# Patient Record
Sex: Female | Born: 1975 | Race: White | Hispanic: No | Smoking: Current every day smoker
Health system: Southern US, Community
[De-identification: ages and names within clinical notes are randomized; demographics above are authoritative.]

## PROBLEM LIST (undated history)

## (undated) DIAGNOSIS — T8859XA Other complications of anesthesia, initial encounter: Secondary | ICD-10-CM

## (undated) DIAGNOSIS — F32A Depression, unspecified: Secondary | ICD-10-CM

## (undated) DIAGNOSIS — D219 Benign neoplasm of connective and other soft tissue, unspecified: Secondary | ICD-10-CM

## (undated) DIAGNOSIS — F319 Bipolar disorder, unspecified: Secondary | ICD-10-CM

## (undated) DIAGNOSIS — N83209 Unspecified ovarian cyst, unspecified side: Secondary | ICD-10-CM

## (undated) DIAGNOSIS — F1910 Other psychoactive substance abuse, uncomplicated: Secondary | ICD-10-CM

## (undated) DIAGNOSIS — F419 Anxiety disorder, unspecified: Secondary | ICD-10-CM

## (undated) HISTORY — PX: OTHER SURGICAL HISTORY: SHX169

## (undated) HISTORY — DX: Benign neoplasm of connective and other soft tissue, unspecified: D21.9

## (undated) HISTORY — DX: Unspecified ovarian cyst, unspecified side: N83.209

---

## 1997-09-11 ENCOUNTER — Other Ambulatory Visit: Admission: RE | Admit: 1997-09-11 | Discharge: 1997-09-11 | Payer: Self-pay | Admitting: Obstetrics & Gynecology

## 1998-02-23 ENCOUNTER — Inpatient Hospital Stay (HOSPITAL_COMMUNITY): Admission: AD | Admit: 1998-02-23 | Discharge: 1998-02-25 | Payer: Self-pay | Admitting: *Deleted

## 1998-10-10 ENCOUNTER — Other Ambulatory Visit: Admission: RE | Admit: 1998-10-10 | Discharge: 1998-10-10 | Payer: Self-pay | Admitting: Obstetrics and Gynecology

## 1998-10-11 ENCOUNTER — Ambulatory Visit (HOSPITAL_COMMUNITY): Admission: RE | Admit: 1998-10-11 | Discharge: 1998-10-11 | Payer: Self-pay | Admitting: Obstetrics & Gynecology

## 1999-03-04 ENCOUNTER — Inpatient Hospital Stay (HOSPITAL_COMMUNITY): Admission: AD | Admit: 1999-03-04 | Discharge: 1999-03-06 | Payer: Self-pay | Admitting: Obstetrics and Gynecology

## 2000-10-26 ENCOUNTER — Emergency Department (HOSPITAL_COMMUNITY): Admission: EM | Admit: 2000-10-26 | Discharge: 2000-10-27 | Payer: Self-pay | Admitting: Emergency Medicine

## 2002-07-01 ENCOUNTER — Emergency Department (HOSPITAL_COMMUNITY): Admission: EM | Admit: 2002-07-01 | Discharge: 2002-07-01 | Payer: Self-pay | Admitting: Emergency Medicine

## 2002-11-21 ENCOUNTER — Other Ambulatory Visit: Admission: RE | Admit: 2002-11-21 | Discharge: 2002-11-21 | Payer: Self-pay | Admitting: Obstetrics and Gynecology

## 2011-11-03 ENCOUNTER — Emergency Department (HOSPITAL_COMMUNITY)
Admission: EM | Admit: 2011-11-03 | Discharge: 2011-11-03 | Disposition: A | Discharge status: Home / Self Care | Payer: Self-pay | Attending: Emergency Medicine | Admitting: Emergency Medicine

## 2011-11-03 ENCOUNTER — Encounter (HOSPITAL_COMMUNITY): Payer: Self-pay | Admitting: Emergency Medicine

## 2011-11-03 DIAGNOSIS — K0889 Other specified disorders of teeth and supporting structures: Secondary | ICD-10-CM

## 2011-11-03 DIAGNOSIS — F17200 Nicotine dependence, unspecified, uncomplicated: Secondary | ICD-10-CM | POA: Insufficient documentation

## 2011-11-03 DIAGNOSIS — K089 Disorder of teeth and supporting structures, unspecified: Secondary | ICD-10-CM | POA: Insufficient documentation

## 2011-11-03 DIAGNOSIS — K0381 Cracked tooth: Secondary | ICD-10-CM | POA: Insufficient documentation

## 2011-11-03 MED ORDER — OXYCODONE-ACETAMINOPHEN 5-325 MG PO TABS: 2.0000 | ORAL_TABLET | ORAL | Status: AC | PRN

## 2011-11-03 MED ORDER — PENICILLIN V POTASSIUM 250 MG PO TABS: 250.0000 mg | ORAL_TABLET | Freq: Four times a day (QID) | ORAL | Status: AC

## 2011-11-03 MED ORDER — OXYCODONE-ACETAMINOPHEN 5-325 MG PO TABS
1.0000 | ORAL_TABLET | Freq: Once | ORAL | Status: AC
  Administered 2011-11-03: 1 via ORAL
  Filled 2011-11-03: qty 1

## 2011-11-03 NOTE — ED Notes (Signed)
Tooth pain since last pm   

## 2011-11-03 NOTE — ED Provider Notes (Signed)
History     CSN: [id number]  Arrival date & time 11/03/11  1637   First MD Initiated Contact with Patient 11/03/11 1857      Chief Complaint  Patient presents with  . Dental Pain    (Consider location/radiation/quality/duration/timing/severity/associated sxs/prior treatment) Patient is a 36 y.o. female presenting with tooth pain. The history is provided by the patient.  Dental PainThe primary symptoms include mouth pain. Primary symptoms do not include dental injury, oral bleeding, oral lesions, headaches or fever. The symptoms began yesterday. The symptoms are worsening. The symptoms are new. The symptoms occur constantly.  Additional symptoms include: dental sensitivity to temperature. Additional symptoms do not include: gum swelling, gum tenderness, purulent gums, trismus, facial swelling, trouble swallowing, pain with swallowing and drooling. Medical issues include: periodontal disease.    No past medical history on file.  No past surgical history on file.  No family history on file.  History  Substance Use Topics  . Smoking status: Current Everyday Smoker -- 1.0 packs/day  . Smokeless tobacco: Not on file  . Alcohol Use: Yes     occasionally    OB History    Grav Para Term Preterm Abortions TAB SAB Ect Mult Living                  Review of Systems  Constitutional: Negative for fever.  HENT: Negative for facial swelling, drooling, trouble swallowing and neck pain.   Neurological: Negative for headaches.    Allergies  Review of patient's allergies indicates no known allergies.  Home Medications   Current Outpatient Rx  Name Route Sig Dispense Refill  . ACETAMINOPHEN 500 MG PO TABS Oral Take 1,000 mg by mouth every 6 (six) hours as needed. For pain.    Marland Kitchen BIOTIN PO Oral Take 1 tablet by mouth daily.    . ADULT MULTIVITAMIN W/MINERALS CH Oral Take 1 tablet by mouth daily.      BP 160/88  Pulse 70  Temp(Src) 98.5 F (36.9 C) (Oral)  Resp 20  SpO2 100%   LMP 10/27/2011  Physical Exam  Nursing note and vitals reviewed. Constitutional: She appears well-developed and well-nourished. No distress.  HENT:  Head: Normocephalic and atraumatic.  Mouth/Throat:         Tender to palp to 1st molar as diagrammed. Tooth is broken. Gums are not purulent, no gross evidence of abscess. Oropharynx clear. No ttp to floor of mouth or trismus. No facial/neck swelling.  Neck: Normal range of motion.  Cardiovascular: Normal rate.   Pulmonary/Chest: Effort normal.  Musculoskeletal: Normal range of motion.  Neurological: She is alert.  Skin: Skin is warm and dry. She is not diaphoretic.  Psychiatric: She has a normal mood and affect.    ED Course  Procedures (including critical care time)  Labs Reviewed - No data to display No results found.   1. Pain, dental       MDM  Pt with dental pain - tender to palpation without obvious evidence of abscess. She has already arranged dental followup. Rx for pain meds, abx. Return precautions discussed.        Grant Fontana, [place] 11/04/11 2248

## 2011-11-03 NOTE — Discharge Instructions (Signed)
Please take ALL of the antibiotic until gone. Use the pain medication as needed. Ice to the area may also be helpful. Plan to make a follow up with a dentist as soon as you can. Return to the ED if you have difficulty opening your mouth, neck swelling or swelling under the jaw, trouble breathing or swallowing, or any other worrisome symptoms.  RESOURCE GUIDE  Dental Problems  Patients with Medicaid: [facility] [phone] [address]                                           [phone] [address] Phone:  [phone]                                                  Phone:  [phone]  If unable to pay or uninsured, contact:  [facility] or [facility]. to become qualified for the adult dental clinic.  Chronic Pain Problems Contact [facility]  [phone] Patients need to be referred by their primary care doctor.  Insufficient Money for Medicine Contact [employer]:  call "211" or [facility] 301-366-2182.  No Primary Care Doctor Call [facility]  [phone] Other agencies that provide inexpensive medical care    [facility]  [phone]    [facility]  [phone]    [facility]  [phone]    [facility]  [phone]    [facility]  ([phone]    [facility]  [phone]  [facility]  [phone] [facility]  [phone] [facility]   [phone] (emergency services [phone])  Substance Abuse Resources [facility]  [phone] [facility] [phone] [place] [phone] Floydene Flock [phone] Residential & Outpatient Substance Abuse Program  [phone]  Abuse/Neglect [facility] Child Abuse Hotline (914)260-1973 [facility] Child Abuse Hotline 614-793-8151 (After Hours)  Emergency Shelter [facility] 682-379-9427  Maternity Homes Room at the [facility] [phone] [facility] (905)433-6046  MRSA Hotline #:   503-525-3666    [facility] Resources  [facility]     [employer]                          [facility] Dept. [address] [place]                       [address]      [address]  [place]                                                Cristobal Goldmann Phone:  [phone]  Phone:  [phone]                 Phone:  [phone]  [facility] Phone:  [phone]  [facility] Child Abuse Hotline (772)832-1843 805-799-6497 (After Hours)  Dental Pain A tooth ache may be caused by cavities (tooth decay). Cavities expose the nerve of the tooth to air and hot or cold temperatures. It may come from an infection or abscess (also called a boil or furuncle) around your tooth. It is also often caused by dental caries (tooth decay). This causes the pain you are having. DIAGNOSIS  Your caregiver can diagnose this problem by exam. TREATMENT   If caused by an infection, it may be treated with medications which kill germs (antibiotics) and pain medications as prescribed by your caregiver. Take medications as directed.   Only take over-the-counter or prescription medicines for pain, discomfort, or fever as directed by your caregiver.   Whether the tooth ache today is caused by infection or dental disease, you should see your dentist as soon as possible for further care.  SEEK MEDICAL CARE IF: The exam and treatment you received today has been provided on an emergency basis only. This is not a substitute for complete medical or dental care. If your problem worsens or new problems (symptoms) appear, and you are unable to meet with your dentist, call or return to this location. SEEK IMMEDIATE MEDICAL CARE IF:   You have a fever.    You develop redness and swelling of your face, jaw, or neck.   You are unable to open your mouth.   You have severe pain uncontrolled by pain medicine.  MAKE SURE YOU:   Understand these instructions.   Will watch your condition.   Will get help right away if you are not doing well or get worse.  Document Released: 06/02/2005 Document Revised: 05/22/2011 Document Reviewed: 01/19/2008 [facility] Patient Information 2012 [place], [place].

## 2011-11-06 NOTE — ED Provider Notes (Signed)
Medical screening examination/treatment/procedure(s) were performed by non-physician practitioner and as supervising physician I was immediately available for consultation/collaboration.   Rolan Bucco, MD 11/06/11 [phone]

## 2012-09-09 ENCOUNTER — Encounter: Payer: Self-pay | Admitting: Obstetrics & Gynecology

## 2012-10-11 ENCOUNTER — Ambulatory Visit (INDEPENDENT_AMBULATORY_CARE_PROVIDER_SITE_OTHER): Payer: Self-pay | Admitting: Obstetrics & Gynecology

## 2012-10-11 ENCOUNTER — Encounter: Payer: Self-pay | Admitting: Obstetrics & Gynecology

## 2012-10-11 VITALS — BP 160/97 | HR 70 | Temp 96.6°F | Ht 67.0 in | Wt 179.0 lb

## 2012-10-11 DIAGNOSIS — D259 Leiomyoma of uterus, unspecified: Secondary | ICD-10-CM

## 2012-10-11 DIAGNOSIS — D219 Benign neoplasm of connective and other soft tissue, unspecified: Secondary | ICD-10-CM

## 2012-10-11 DIAGNOSIS — N920 Excessive and frequent menstruation with regular cycle: Secondary | ICD-10-CM

## 2012-10-11 NOTE — Patient Instructions (Addendum)
Uterine Fibroid A uterine fibroid is a growth (tumor) that occurs in a woman's uterus. This type of tumor is not cancerous and does not spread out of the uterus. A woman can have one or many fibroids, and the fiboid(s) can become quite large. A fibroid can vary in size, weight, and where it grows in the uterus. Most fibroids do not require medical treatment, but some can cause pain or heavy bleeding during and between periods. CAUSES  A fibroid is the result of a single uterine cell that keeps growing (unregulated), which is different than most cells in the human body. Most cells have a control mechanism that keeps them from reproducing without control.  SYMPTOMS   Bleeding.  Pelvic pain and pressure.  Bladder problems due to the size of the fibroid.  Infertility and miscarriages depending on the size and location of the fibroid. DIAGNOSIS  A diagnosis is made by physical exam. Your caregiver may feel the lumpy tumors during a pelvic exam. Important information regarding size, location, and number of tumors can be gained by having an ultrasound. It is rare that other tests, such as a CT scan or MRI, are needed. TREATMENT   Your caregiver may recommend watchful waiting. This involves getting the fibroid checked by your caregiver to see if the fibroids grow or shrink.   Hormonal treatment or an intrauterine device (IUD) may be prescribed.   Surgery may be needed to remove the fibroids (myomectomy) or the uterus (hysterectomy). This depends on your situation. When fibroids interfere with fertility and a woman wants to become pregnant, a caregiver may recommend having the fibroids removed.  HOME CARE INSTRUCTIONS  Home care depends on how you were treated. In general:   Keep all follow-up appointments with your caregiver.   Only take medicine as told by your caregiver. Do not take aspirin. It can cause bleeding.   If you have excessive periods and soak tampons or pads in a half hour or  less, contact your caregiver immediately. If your periods are troublesome but not so heavy, lie down with your feet raised slightly above your heart. Place cold packs on your lower abdomen.   If your periods are heavy, write down the number of pads or tampons you use per month. Bring this information to your caregiver.   Talk to your caregiver about taking iron pills.   Include green vegetables in your diet.   If you were prescribed a hormonal treatment, take the hormonal medicines as directed.   If you need surgery, ask your caregiver for information on your specific surgery.  SEEK IMMEDIATE MEDICAL CARE IF:  You have pelvic pain or cramps not controlled with medicines.   You have a sudden increase in pelvic pain.   You have an increase of bleeding between and during periods.   You feel lightheaded or have fainting episodes.  MAKE SURE YOU:  Understand these instructions.  Will watch your condition.  Will get help right away if you are not doing well or get worse. Document Released: 05/30/2000 Document Revised: 08/25/2011 Document Reviewed: 06/23/2011 [facility] Patient Information 2013 [place], [place].  Levonorgestrel intrauterine device (IUD) What is this medicine? LEVONORGESTREL IUD (LEE voe nor jes trel) is a contraceptive (birth control) device. It is used to prevent pregnancy and to treat heavy bleeding that occurs during your period. It can be used for up to 5 years. This medicine may be used for other purposes; ask your health care provider or pharmacist if you have questions.  What should I tell my health care provider before I take this medicine? They need to know if you have any of these conditions: -abnormal Pap smear -cancer of the breast, uterus, or cervix -diabetes -endometritis -genital or pelvic infection now or in the past -have more than one sexual partner or your partner has more than one partner -heart disease -history of an ectopic or tubal  pregnancy -immune system problems -IUD in place -liver disease or tumor -problems with blood clots or take blood-thinners -use intravenous drugs -uterus of unusual shape -vaginal bleeding that has not been explained -an unusual or allergic reaction to levonorgestrel, other hormones, silicone, or polyethylene, medicines, foods, dyes, or preservatives -pregnant or trying to get pregnant -breast-feeding How should I use this medicine? This device is placed inside the uterus by a health care professional. Talk to your pediatrician regarding the use of this medicine in children. Special care may be needed. Overdosage: If you think you have taken too much of this medicine contact a poison control center or emergency room at once. NOTE: This medicine is only for you. Do not share this medicine with others. What if I miss a dose? This does not apply. What may interact with this medicine? Do not take this medicine with any of the following medications: -amprenavir -bosentan -fosamprenavir This medicine may also interact with the following medications: -aprepitant -barbiturate medicines for inducing sleep or treating seizures -bexarotene -griseofulvin -medicines to treat seizures like carbamazepine, ethotoin, felbamate, oxcarbazepine, phenytoin, topiramate -modafinil -pioglitazone -rifabutin -rifampin -rifapentine -some medicines to treat HIV infection like atazanavir, indinavir, lopinavir, nelfinavir, tipranavir, ritonavir -St. John's wort -warfarin This list may not describe all possible interactions. Give your health care provider a list of all the medicines, herbs, non-prescription drugs, or dietary supplements you use. Also tell them if you smoke, drink alcohol, or use illegal drugs. Some items may interact with your medicine. What should I watch for while using this medicine? Visit your doctor or health care professional for regular check ups. See your doctor if you or your partner  has sexual contact with others, becomes HIV positive, or gets a sexual transmitted disease. This product does not protect you against HIV infection (AIDS) or other sexually transmitted diseases. You can check the placement of the IUD yourself by reaching up to the top of your vagina with clean fingers to feel the threads. Do not pull on the threads. It is a good habit to check placement after each menstrual period. Call your doctor right away if you feel more of the IUD than just the threads or if you cannot feel the threads at all. The IUD may come out by itself. You may become pregnant if the device comes out. If you notice that the IUD has come out use a backup birth control method like condoms and call your health care provider. Using tampons will not change the position of the IUD and are okay to use during your period. What side effects may I notice from receiving this medicine? Side effects that you should report to your doctor or health care professional as soon as possible: -allergic reactions like skin rash, itching or hives, swelling of the face, lips, or tongue -fever, flu-like symptoms -genital sores -high blood pressure -no menstrual period for 6 weeks during use -pain, swelling, warmth in the leg -pelvic pain or tenderness -severe or sudden headache -signs of pregnancy -stomach cramping -sudden shortness of breath -trouble with balance, talking, or walking -unusual vaginal bleeding, discharge -yellowing of  the eyes or skin Side effects that usually do not require medical attention (report to your doctor or health care professional if they continue or are bothersome): -acne -breast pain -change in sex drive or performance -changes in weight -cramping, dizziness, or faintness while the device is being inserted -headache -irregular menstrual bleeding within first 3 to 6 months of use -nausea This list may not describe all possible side effects. Call your doctor for medical  advice about side effects. You may report side effects to FDA at 1-800-FDA-1088. Where should I keep my medicine? This does not apply. NOTE: This sheet is a summary. It may not cover all possible information. If you have questions about this medicine, talk to your doctor, pharmacist, or health care provider.  2012, [employer] Standard. (06/23/2008 6:39:08 PM)Menorrhagia Dysfunctional uterine bleeding is different from a normal menstrual period. When periods are heavy or there is more bleeding than is usual for you, it is called menorrhagia. It may be caused by hormonal imbalance, or physical, metabolic, or other problems. Examination is necessary in order that your caregiver may treat treatable causes. If this is a continuing problem, a D&C may be needed. That means that the cervix (the opening of the uterus or womb) is dilated (stretched larger) and the lining of the uterus is scraped out. The tissue scraped out is then examined under a microscope by a specialist (pathologist) to make sure there is nothing of concern that needs further or more extensive treatment. HOME CARE INSTRUCTIONS   If medications were prescribed, take exactly as directed. Do not change or switch medications without consulting your caregiver.  Long term heavy bleeding may result in iron deficiency. Your caregiver may have prescribed iron pills. They help replace the iron your body lost from heavy bleeding. Take exactly as directed. Iron may cause constipation. If this becomes a problem, increase the bran, fruits, and roughage in your diet.  Do not take aspirin or medicines that contain aspirin one week before or during your menstrual period. Aspirin may make the bleeding worse.  If you need to change your sanitary pad or tampon more than once every 2 hours, stay in bed and rest as much as possible until the bleeding stops.  Eat well-balanced meals. Eat foods high in iron. Examples are leafy green vegetables, meat, liver,  eggs, and whole grain breads and cereals. Do not try to lose weight until the abnormal bleeding has stopped and your blood iron level is back to normal. SEEK MEDICAL CARE IF:   You need to change your sanitary pad or tampon more than once an hour.  You develop nausea (feeling sick to your stomach) and vomiting, dizziness, or diarrhea while you are taking your medicine.  You have any problems that may be related to the medicine you are taking. SEEK IMMEDIATE MEDICAL CARE IF:   You have a fever.  You develop chills.  You develop severe bleeding or start to pass blood clots.  You feel dizzy or faint. MAKE SURE YOU:   Understand these instructions.  Will watch your condition.  Will get help right away if you are not doing well or get worse. Document Released: 06/02/2005 Document Revised: 08/25/2011 Document Reviewed: 01/21/2008 [facility] Patient Information 2013 [place], [place].

## 2012-10-11 NOTE — Progress Notes (Signed)
Subjective:     Patient ID: Danielle Nolan, female   DOB: Jan 23, 1976, 37 y.o.   MRN: [id number]  HPI Pt is a 36yo [id number] who presents with c/o heavy bleeding and cramping.  She reports that her bleeding became worse after her BTL in 2000.  She says that it became worse in the past 2 months.  She reports passage of clots with cycles.  Pain becomes worse with cycles.  She reports that she had an abnormal PAP 1 months ago at the [facility]- does not know the results and thinks she was told to f/u in '2 months'  Pt reports that her husband has mentioned tubal reversal and having another baby. She is unsure but, does not want a hyst.    History reviewed. No pertinent past medical history.  Past Surgical History  Procedure Laterality Date  . Btl    . Cryo therapy      done at age 47.  Dx with cervical cancer   Current Outpatient Prescriptions on File Prior to Visit  Medication Sig Dispense Refill  . acetaminophen (TYLENOL) 500 MG tablet Take 1,000 mg by mouth every 6 (six) hours as needed. For pain.      . Multiple Vitamin (MULITIVITAMIN WITH MINERALS) TABS Take 1 tablet by mouth daily.      Marland Kitchen BIOTIN PO Take 1 tablet by mouth daily.       No current facility-administered medications on file prior to visit.  No Known Allergies History   Social History  . Marital Status: Married    Spouse Name: N/A    Number of Children: N/A  . Years of Education: N/A   Occupational History  . Not on file.   Social History Main Topics  . Smoking status: Current Every Day Smoker -- 1.00 packs/day  . Smokeless tobacco: Not on file  . Alcohol Use: Yes     Comment: occasionally  . Drug Use: No  . Sexually Active: Yes    Birth Control/ Protection: Surgical   Other Topics Concern  . Not on file   Social History Narrative  . No narrative on file   Family History  Problem Relation Age of Onset  . Hypertension Maternal Grandmother   . Heart disease Maternal Grandmother   . Cancer Maternal Grandmother   .  Hypertension Maternal Grandfather   . Diabetes Maternal Grandfather   . Cancer Maternal Grandfather   . Hypertension Paternal Grandmother   . Hypertension Paternal Grandfather     Review of Systems     Objective:   Physical Exam BP 160/97  Pulse 70  Temp(Src) 96.6 F (35.9 C) (Oral)  Ht 5\' 7"  (1.702 m)  Wt 179 lb (81.194 kg)  BMI 28.03 kg/m2      Assessment:     Elevated BP (will recheck on her next visit.  If it is still elevated on her next visit, will begin meds and refer her to primary care Menorrhagia- thought due to uterine fibroids       Plan:     Info given on Mirena to control cycles  F/u 5 weeks for mirena Need records of sono from [facility]  Need records of PAP from the HD

## 2012-10-11 NOTE — Progress Notes (Signed)
Patient here for pain related to ovarian cyst and fibroids.  She typically has normal blood pressure readings.  Today's reading was high and she reports that she has had a continuous headache for a very long time.  She has also noticed excess weight gain (30lbs in 6 weeks) and reports no changes in her diet or activity level.

## 2012-11-15 ENCOUNTER — Encounter: Payer: Self-pay | Admitting: Obstetrics & Gynecology

## 2012-11-15 ENCOUNTER — Ambulatory Visit (INDEPENDENT_AMBULATORY_CARE_PROVIDER_SITE_OTHER): Payer: BC Managed Care – PPO | Admitting: Obstetrics & Gynecology

## 2012-11-15 VITALS — BP 130/84 | HR 64 | Temp 97.8°F | Ht 66.0 in | Wt 175.0 lb

## 2012-11-15 DIAGNOSIS — R102 Pelvic and perineal pain: Secondary | ICD-10-CM

## 2012-11-15 DIAGNOSIS — R87619 Unspecified abnormal cytological findings in specimens from cervix uteri: Secondary | ICD-10-CM

## 2012-11-15 DIAGNOSIS — Z30430 Encounter for insertion of intrauterine contraceptive device: Secondary | ICD-10-CM

## 2012-11-15 DIAGNOSIS — IMO0002 Reserved for concepts with insufficient information to code with codable children: Secondary | ICD-10-CM

## 2012-11-15 DIAGNOSIS — R6889 Other general symptoms and signs: Secondary | ICD-10-CM

## 2012-11-15 DIAGNOSIS — N949 Unspecified condition associated with female genital organs and menstrual cycle: Secondary | ICD-10-CM

## 2012-11-15 LAB — POCT PREGNANCY, URINE: Preg Test, Ur: NEGATIVE

## 2012-11-15 MED ORDER — LEVONORGESTREL 20 MCG/24HR IU IUD
INTRAUTERINE_SYSTEM | Freq: Once | INTRAUTERINE | Status: AC
Start: 2012-11-15 — End: 2012-11-15
  Administered 2012-11-15: 1 via INTRAUTERINE

## 2012-11-15 MED ORDER — TRAMADOL HCL 50 MG PO TABS: 50.0000 mg | ORAL_TABLET | Freq: Four times a day (QID) | ORAL | Status: DC | PRN

## 2012-11-15 MED ORDER — DICLOFENAC SODIUM 75 MG PO TBEC: 75.0000 mg | DELAYED_RELEASE_TABLET | Freq: Two times a day (BID) | ORAL | Status: DC

## 2012-11-15 NOTE — Progress Notes (Signed)
Patient ID: Danielle Nolan, female   DOB: 1976/03/20, 36 y.o.   MRN: [id number] GYNECOLOGY CLINIC PROCEDURE NOTE  Danielle Nolan is a 37 y.o. [phone] here for Mirena IUD insertion. No GYN concerns.  Last pap smear was abnormal per patient at the HD.  She was told to get a repeat in 2 months.  Cannot locate copy of PAP.  Cx negative at that time.  PAP repeated today.  IUD Insertion Procedure Note Patient identified, informed consent performed.  Discussed risks of irregular bleeding, cramping, infection, malpositioning or misplacement of the IUD outside the uterus which may require further procedures. Time out was performed.  Urine pregnancy test negative.  Speculum placed in the vagina.  Cervix visualized.  Cleaned with Betadine x 2.  Grasped anteriorly with a single tooth tenaculum.  Uterus sounded to 9 cm.  Mirena IUD placed per manufacturer's recommendations.  Strings trimmed to 3 cm. Tenaculum was removed, good hemostasis noted.  Patient tolerated procedure well.   Patient was given post-procedure instructions.  Patient was also asked to check IUD strings periodically and follow up in 8 weeks for IUD check. Pap done today.  Given rx for Voltaren and Ultram.  If pain persists despite Mirena, consider hysterectomy.

## 2012-11-15 NOTE — Patient Instructions (Signed)

## 2012-12-23 ENCOUNTER — Emergency Department (HOSPITAL_COMMUNITY)
Admission: EM | Admit: 2012-12-23 | Discharge: 2012-12-23 | Disposition: A | Discharge status: Home / Self Care | Payer: BC Managed Care – PPO | Attending: Emergency Medicine | Admitting: Emergency Medicine

## 2012-12-23 ENCOUNTER — Encounter (HOSPITAL_COMMUNITY): Payer: Self-pay | Admitting: Emergency Medicine

## 2012-12-23 ENCOUNTER — Emergency Department (HOSPITAL_COMMUNITY): Payer: BC Managed Care – PPO

## 2012-12-23 DIAGNOSIS — F17200 Nicotine dependence, unspecified, uncomplicated: Secondary | ICD-10-CM | POA: Insufficient documentation

## 2012-12-23 DIAGNOSIS — N83209 Unspecified ovarian cyst, unspecified side: Secondary | ICD-10-CM

## 2012-12-23 DIAGNOSIS — Z791 Long term (current) use of non-steroidal anti-inflammatories (NSAID): Secondary | ICD-10-CM | POA: Insufficient documentation

## 2012-12-23 DIAGNOSIS — Z79899 Other long term (current) drug therapy: Secondary | ICD-10-CM | POA: Insufficient documentation

## 2012-12-23 DIAGNOSIS — Z3202 Encounter for pregnancy test, result negative: Secondary | ICD-10-CM | POA: Insufficient documentation

## 2012-12-23 DIAGNOSIS — N949 Unspecified condition associated with female genital organs and menstrual cycle: Secondary | ICD-10-CM | POA: Insufficient documentation

## 2012-12-23 LAB — URINE MICROSCOPIC-ADD ON

## 2012-12-23 LAB — URINALYSIS, ROUTINE W REFLEX MICROSCOPIC
Bilirubin Urine: NEGATIVE
Hgb urine dipstick: NEGATIVE
Ketones, ur: NEGATIVE mg/dL
Nitrite: NEGATIVE
Protein, ur: NEGATIVE mg/dL
Specific Gravity, Urine: 1.014 (ref 1.005–1.030)
Urobilinogen, UA: 1 mg/dL (ref 0.0–1.0)

## 2012-12-23 MED ORDER — ONDANSETRON 8 MG PO TBDP
8.0000 mg | ORAL_TABLET | Freq: Once | ORAL | Status: AC
  Administered 2012-12-23: 8 mg via ORAL
  Filled 2012-12-23: qty 1

## 2012-12-23 MED ORDER — HYDROMORPHONE HCL PF 1 MG/ML IJ SOLN
1.0000 mg | Freq: Once | INTRAMUSCULAR | Status: AC
  Administered 2012-12-23: 1 mg via INTRAMUSCULAR
  Filled 2012-12-23: qty 1

## 2012-12-23 MED ORDER — HYDROCODONE-ACETAMINOPHEN 5-325 MG PO TABS: 2.0000 | ORAL_TABLET | ORAL | Status: DC | PRN

## 2012-12-23 MED ORDER — KETOROLAC TROMETHAMINE 60 MG/2ML IM SOLN
60.0000 mg | Freq: Once | INTRAMUSCULAR | Status: AC
  Administered 2012-12-23: 60 mg via INTRAMUSCULAR
  Filled 2012-12-23: qty 2

## 2012-12-23 NOTE — ED Notes (Signed)
MD at bedside. 

## 2012-12-23 NOTE — ED Notes (Signed)
Pt complains of abdominal pain x 2 years. Pt currently being treated with hormone therapy for cysts but states "the pain is getting worse" Pt denies vomiting at this time.

## 2012-12-23 NOTE — ED Provider Notes (Signed)
History    CSN: [id number] Arrival date & time 12/23/12  1054  First MD Initiated Contact with Patient 12/23/12 1117     Chief Complaint  Patient presents with  . Abdominal Pain   (Consider location/radiation/quality/duration/timing/severity/associated sxs/prior Treatment) HPI Comments: Patient presents to ER for evaluation of pelvic pain. This has been experiencing pelvic pain intermittently for 2 years. She has been told she has fibroid tumors as well as ovarian cysts. She has recently started seeing Doctor Haraway-Smith. She had a Mirena placed to try and help with the pain. This reports the last couple days she has been having constant, severe pain. Pain is across the lower pelvic region. It is identical to pain that she has had previously. It has not improved with Tylenol or ultram. Patient has not had any fever.  Patient is a 37 y.o. female presenting with abdominal pain.  Abdominal Pain Associated symptoms include abdominal pain.   History reviewed. No pertinent past medical history. Past Surgical History  Procedure Laterality Date  . Btl    . Cryo therapy      done at age 29.  Dx with cervical cancer   Family History  Problem Relation Age of Onset  . Hypertension Maternal Grandmother   . Heart disease Maternal Grandmother   . Cancer Maternal Grandmother   . Hypertension Maternal Grandfather   . Diabetes Maternal Grandfather   . Cancer Maternal Grandfather   . Hypertension Paternal Grandmother   . Hypertension Paternal Grandfather    History  Substance Use Topics  . Smoking status: Current Every Day Smoker -- 1.00 packs/day    Types: Cigarettes  . Smokeless tobacco: Not on file  . Alcohol Use: Yes     Comment: occasionally   OB History   Grav Para Term Preterm Abortions TAB SAB Ect Mult Living   4 3 3  1  1   3      Review of Systems  Gastrointestinal: Positive for abdominal pain.  Genitourinary: Positive for pelvic pain.  All other systems reviewed and are  negative.    Allergies  Review of patient's allergies indicates no known allergies.  Home Medications   Current Outpatient Rx  Name  Route  Sig  Dispense  Refill  . acetaminophen (TYLENOL) 500 MG tablet   Oral   Take 1,000 mg by mouth every 6 (six) hours as needed. For pain.         Marland Kitchen BIOTIN PO   Oral   Take 1 tablet by mouth daily.         . diclofenac (VOLTAREN) 75 MG EC tablet   Oral   Take 1 tablet (75 mg total) by mouth 2 (two) times daily with a meal.   60 tablet   2   . Multiple Vitamin (MULITIVITAMIN WITH MINERALS) TABS   Oral   Take 1 tablet by mouth daily.         . traMADol (ULTRAM) 50 MG tablet   Oral   Take 1 tablet (50 mg total) by mouth every 6 (six) hours as needed for pain.   60 tablet   0    BP 155/74  Pulse 51  Temp(Src) 98.2 F (36.8 C) (Oral)  SpO2 100%  LMP 12/11/2012 Physical Exam  Constitutional: She is oriented to person, place, and time. She appears well-developed and well-nourished. No distress.  HENT:  Head: Normocephalic and atraumatic.  Right Ear: Hearing normal.  Left Ear: Hearing normal.  Nose: Nose normal.  Mouth/Throat: Oropharynx  is clear and moist and mucous membranes are normal.  Eyes: Conjunctivae and EOM are normal. Pupils are equal, round, and reactive to light.  Neck: Normal range of motion. Neck supple.  Cardiovascular: Regular rhythm, S1 normal and S2 normal.  Exam reveals no gallop and no friction rub.   No murmur heard. Pulmonary/Chest: Effort normal and breath sounds normal. No respiratory distress. She exhibits no tenderness.  Abdominal: Soft. Normal appearance and bowel sounds are normal. There is no hepatosplenomegaly. There is tenderness in the suprapubic area. There is no rebound, no guarding, no tenderness at McBurney's point and negative Murphy's sign. No hernia.  Musculoskeletal: Normal range of motion.  Neurological: She is alert and oriented to person, place, and time. She has normal strength. No  cranial nerve deficit or sensory deficit. Coordination normal. GCS eye subscore is 4. GCS verbal subscore is 5. GCS motor subscore is 6.  Skin: Skin is warm, dry and intact. No rash noted. No cyanosis.  Psychiatric: She has a normal mood and affect. Her speech is normal and behavior is normal. Thought content normal.    ED Course  Procedures (including critical care time) Labs Reviewed  URINALYSIS, ROUTINE W REFLEX MICROSCOPIC - Abnormal; Notable for the following:    Leukocytes, UA TRACE (*)    All other components within normal limits  URINE MICROSCOPIC-ADD ON - Abnormal; Notable for the following:    Bacteria, UA FEW (*)    All other components within normal limits  POCT PREGNANCY, URINE   US Transvaginal Non-ob  12/23/2012   *RADIOLOGY REPORT*  Clinical Data: Severe pelvic pain.  TRANSABDOMINAL AND TRANSVAGINAL ULTRASOUND OF PELVIS Technique:  Both transabdominal and transvaginal ultrasound examinations of the pelvis were performed. Transabdominal technique was performed for global imaging of the pelvis including uterus, ovaries, adnexal regions, and pelvic cul-de-sac.  It was necessary to proceed with endovaginal exam following the transabdominal exam to visualize the adnexa.  Comparison:  None  Findings:  Uterus: 9.3 x 6.5 x 7.1 cm.  Two fibroids suspected in the fundal region measuring up to 2.4 cm.  Endometrium: IUD is in place.  10.6 mm maximal thickness.  Right ovary:  3.3 x 2.9 x 2.7 cm.  No dominant worrisome lesion.  2 cm cyst / follicle.  Left ovary: 5.8 x 4.2 x 6 cm.  5.1 x 3.3 x 5.0 cm complex cystic structure.  The appearance is suggestive of a hemorrhagic cyst however, to ensure resolution, follow-up imaging in 6-12 weeks is recommended.  Other findings: Trace free fluid.  Dedicated Doppler imaging was not requested and therefore not performed.  IMPRESSION: 5.1 x 3.3 x 5.0 cm complex left adnexalcystic structure.  The appearance is suggestive of a hemorrhagic ovarian cyst however,  to ensure resolution, follow-up imaging in 6-12 weeks is recommended.  Small uterine fibroids.  IUD in place.   Original Report Authenticated By: Lacy Duverney, M.D.   US Pelvis Complete  12/23/2012   *RADIOLOGY REPORT*  Clinical Data: Severe pelvic pain.  TRANSABDOMINAL AND TRANSVAGINAL ULTRASOUND OF PELVIS Technique:  Both transabdominal and transvaginal ultrasound examinations of the pelvis were performed. Transabdominal technique was performed for global imaging of the pelvis including uterus, ovaries, adnexal regions, and pelvic cul-de-sac.  It was necessary to proceed with endovaginal exam following the transabdominal exam to visualize the adnexa.  Comparison:  None  Findings:  Uterus: 9.3 x 6.5 x 7.1 cm.  Two fibroids suspected in the fundal region measuring up to 2.4 cm.  Endometrium: IUD is in  place.  10.6 mm maximal thickness.  Right ovary:  3.3 x 2.9 x 2.7 cm.  No dominant worrisome lesion.  2 cm cyst / follicle.  Left ovary: 5.8 x 4.2 x 6 cm.  5.1 x 3.3 x 5.0 cm complex cystic structure.  The appearance is suggestive of a hemorrhagic cyst however, to ensure resolution, follow-up imaging in 6-12 weeks is recommended.  Other findings: Trace free fluid.  Dedicated Doppler imaging was not requested and therefore not performed.  IMPRESSION: 5.1 x 3.3 x 5.0 cm complex left adnexalcystic structure.  The appearance is suggestive of a hemorrhagic ovarian cyst however, to ensure resolution, follow-up imaging in 6-12 weeks is recommended.  Small uterine fibroids.  IUD in place.   Original Report Authenticated By: Lacy Duverney, M.D.   Diagnosis: Ovarian cyst  MDM  Patient presents to the ER for evaluation of pelvic pain. Patient has a history of dysmenorrhea secondary to uterine fibroids as well as ovarian cysts. Patient's pain worsened today. Pain is similar to pain that she has had in the past with her ovarian cysts. Patient administered analgesia. Ultrasound does show large complex left ovarian cyst which  is consistent with a hemorrhagic cyst. Patient will signs are stable, no concern for ongoing hemorrhage. Patient to be treated with analgesia, follow up with her OB-GYN.  Gilda Crease, MD 12/23/12 [phone]

## 2012-12-23 NOTE — ED Notes (Signed)
Patient transported to Ultrasound 

## 2013-01-17 ENCOUNTER — Encounter: Payer: Self-pay | Admitting: *Deleted

## 2013-01-27 ENCOUNTER — Encounter: Payer: Self-pay | Admitting: Obstetrics & Gynecology

## 2013-01-27 ENCOUNTER — Ambulatory Visit (INDEPENDENT_AMBULATORY_CARE_PROVIDER_SITE_OTHER): Payer: BC Managed Care – PPO | Admitting: Obstetrics & Gynecology

## 2013-01-27 VITALS — BP 139/91 | HR 66 | Temp 98.1°F | Ht 64.5 in | Wt 174.7 lb

## 2013-01-27 DIAGNOSIS — N83209 Unspecified ovarian cyst, unspecified side: Secondary | ICD-10-CM

## 2013-01-27 DIAGNOSIS — R102 Pelvic and perineal pain: Secondary | ICD-10-CM

## 2013-01-27 DIAGNOSIS — N949 Unspecified condition associated with female genital organs and menstrual cycle: Secondary | ICD-10-CM

## 2013-01-27 MED ORDER — AMITRIPTYLINE HCL 50 MG PO TABS: 50.0000 mg | ORAL_TABLET | Freq: Every day | ORAL | Status: DC

## 2013-01-27 NOTE — Progress Notes (Signed)
Urology Referral made to [facility] August 22nd @ 215pm

## 2013-01-27 NOTE — Progress Notes (Signed)
Patient ID: Danielle Nolan, female   DOB: 1975/10/31, 37 y.o.   MRN: [id number] Danielle Nolan is an 37 y.o. female with a 14 yr h/o fibroids and ovarian cysts here with for an ED follow up for abdominal pain and ovarian cyst. She has a 14 yr history of pain associated with her fibroids and cysts which has gotten significantly worse in the past 3 months.  She has been to the ED a few times since June, 2014 where they confirmed fibroids and cysts with ultrasound that have not changed in size significantly.  She has been in significant pain, 8/10, which has only been helped by Vicodin bringing her pain to 4/10.  She had a Mirena placed on 11/15/12 and has experienced spotting since with her last menstruation on 01/10/13 just as heavy bleeding but reduced cramping.  Pertinent Gynecological History: Menses: flow is heavy, cycle regular Contraception: tubal ligation Sexually transmitted diseases: no past history Last pap: normal Date: 11/15/12 OB History: G4, P3013     Past Medical History  Diagnosis Date  . Ovarian cyst   . Fibroid     Past Surgical History  Procedure Laterality Date  . Btl  2000  . Cryo therapy      done at age 73.  Dx with cervical cancer    Family History  Problem Relation Age of Onset  . Hypertension Maternal Grandmother   . Heart disease Maternal Grandmother   . Cancer Maternal Grandmother   . Hypertension Maternal Grandfather   . Diabetes Maternal Grandfather   . Cancer Maternal Grandfather   . Hypertension Paternal Grandmother   . Hypertension Paternal Grandfather     Social History:  reports that she has been smoking Cigarettes.  She has been smoking about 1.00 pack per day. She has never used smokeless tobacco. She reports that  drinks alcohol. She reports that she does not use illicit drugs.  Allergies: No Known Allergies  Review of Systems  Constitutional: Negative for fever and chills.  Gastrointestinal: Positive for abdominal pain (positional lower  abdominal pain, especially with standing, hunching foward relieves pain).  Genitourinary: Positive for urgency and frequency (gets up nightly to use the bathroom). Negative for hematuria and flank pain. Dysuria: occassional dysuria.    Blood pressure 139/91, pulse 66, temperature 98.1 F (36.7 C), height 5' 4.5" (1.638 m), weight 79.243 kg (174 lb 11.2 oz), last menstrual period 01/10/2013. Physical Exam  Constitutional: She is oriented to person, place, and time. She appears well-developed and well-nourished.  Maintaining position of sitting on exam table while leaning forward and bracing arms on legs  GI: Soft. She exhibits no distension. There is tenderness (notable tenderness to light palpation in RLQ and suprapubic region). There is no rebound and no guarding.  Genitourinary:  No masses, uterus normal size, suprapubic tenderness, and no CMT on bimanual exam.  Neurological: She is alert and oriented to person, place, and time.    Assessment/Plan: A: Pelvic pain suspicious of interstitial cystitis.     Ovarian cysts and fibroids confirmed by U/S  P: Rx Amitriptyline 50mg  QHS      Schedule 4 week follow-up with transvaginal ultrasound      Urology referral  Garnette Czech 01/27/2013, 4:57 PM

## 2013-01-27 NOTE — Patient Instructions (Signed)
Pelvic Pain, Female Female pelvic pain can be caused by many different things and start from a variety of places. Pelvic pain refers to pain that is located in the lower half of the abdomen and between your hips. The pain may occur over a short period of time (acute) or may be reoccurring (chronic). The cause of pelvic pain may be related to disorders affecting the female reproductive organs (gynecologic), but it may also be related to the bladder, kidney stones, an intestinal complication, or muscle or skeletal problems. Getting help right away for pelvic pain is important, especially if there has been severe, sharp, or a sudden onset of unusual pain. It is also important to get help right away because some types of pelvic pain can be life threatening.  CAUSES  Below are only some of the causes of pelvic pain. The causes of pelvic pain can be in one of several categories.   Gynecologic.  Pelvic inflammatory disease.  Sexually transmitted infection.  Ovarian cyst or a twisted ovarian ligament (ovarian torsion).  Uterine lining that grows outside the uterus (endometriosis).  Fibroids, cysts, or tumors.  Ovulation.  Pregnancy.  Pregnancy that occurs outside the uterus (ectopic pregnancy).  Miscarriage.  Labor.  Abruption of the placenta or ruptured uterus.  Infection.  Uterine infection (endometritis).  Bladder infection.  Diverticulitis.  Miscarriage related to a uterine infection (septic abortion).  Bladder.  Inflammation of the bladder ( interstitial cystitis).  Kidney stone(s).  Gastrointenstinal.  Constipation.  Diverticulitis.  Neurologic.  Trauma.  Feeling pelvic pain because of mental or emotional causes (psychosomatic).  Cancers of the bowel or pelvis. EVALUATION  Your caregiver will want to take a careful history of your concerns. This includes recent changes in your health, a careful gynecologic history of your periods (menses), and a sexual  history. Obtaining your family history and medical history is also important. Your caregiver may suggest a pelvic exam. A pelvic exam will help identify the location and severity of the pain. It also helps in the evaluation of which organ system may be involved. In order to identify the cause of the pelvic pain and be properly treated, your caregiver may order tests. These tests may include:   A pregnancy test.  Pelvic ultrasonography.  An X-ray exam of the abdomen.  A urinalysis or evaluation of vaginal discharge.  Blood tests. HOME CARE INSTRUCTIONS   Only take over-the-counter or prescription medicines for pain, discomfort, or fever as directed by your caregiver.   Rest as directed by your caregiver.   Eat a balanced diet.   Drink enough fluids to make your urine clear or pale yellow, or as directed.   Avoid sexual intercourse if it causes pain.   Apply warm or cold compresses to the lower abdomen depending on which one helps the pain.   Avoid stressful situations.   Keep a journal of your pelvic pain. Write down when it started, where the pain is located, and if there are things that seem to be associated with the pain, such as food or your menstrual cycle.  Follow up with your caregiver as directed.  SEEK MEDICAL CARE IF:  Your medicine does not help your pain.  You have abnormal vaginal discharge. SEEK IMMEDIATE MEDICAL CARE IF:   You have heavy bleeding from the vagina.   Your pelvic pain increases.   You feel lightheaded or faint.   You have chills.   You have pain with urination or blood in your urine.   You  have uncontrolled diarrhea or vomiting.   You have a fever or persistent symptoms for more than 3 days.  You have a fever and your symptoms suddenly get worse.   You are being physically or sexually abused.  MAKE SURE YOU:  Understand these instructions.  Will watch your condition.  Will get help if you are not doing well or get  worse. Document Released: 04/29/2004 Document Revised: 12/02/2011 Document Reviewed: 09/22/2011 [facility] Patient Information 2014 [place], [place].

## 2013-01-31 ENCOUNTER — Ambulatory Visit (HOSPITAL_COMMUNITY)
Admission: RE | Admit: 2013-01-31 | Discharge: 2013-01-31 | Disposition: A | Discharge status: Home / Self Care | Payer: BC Managed Care – PPO | Source: Ambulatory Visit | Attending: Obstetrics & Gynecology | Admitting: Obstetrics & Gynecology

## 2013-01-31 DIAGNOSIS — N949 Unspecified condition associated with female genital organs and menstrual cycle: Secondary | ICD-10-CM | POA: Insufficient documentation

## 2013-01-31 DIAGNOSIS — Z30431 Encounter for routine checking of intrauterine contraceptive device: Secondary | ICD-10-CM | POA: Insufficient documentation

## 2013-01-31 DIAGNOSIS — D252 Subserosal leiomyoma of uterus: Secondary | ICD-10-CM | POA: Insufficient documentation

## 2013-01-31 DIAGNOSIS — N83209 Unspecified ovarian cyst, unspecified side: Secondary | ICD-10-CM

## 2013-01-31 DIAGNOSIS — R102 Pelvic and perineal pain: Secondary | ICD-10-CM

## 2013-02-16 ENCOUNTER — Emergency Department (INDEPENDENT_AMBULATORY_CARE_PROVIDER_SITE_OTHER)
Admission: EM | Admit: 2013-02-16 | Discharge: 2013-02-16 | Disposition: A | Discharge status: Home / Self Care | Payer: BC Managed Care – PPO | Source: Home / Self Care | Attending: Family Medicine | Admitting: Family Medicine

## 2013-02-16 ENCOUNTER — Encounter (HOSPITAL_COMMUNITY): Payer: Self-pay | Admitting: *Deleted

## 2013-02-16 DIAGNOSIS — K5289 Other specified noninfective gastroenteritis and colitis: Secondary | ICD-10-CM

## 2013-02-16 DIAGNOSIS — K529 Noninfective gastroenteritis and colitis, unspecified: Secondary | ICD-10-CM

## 2013-02-16 MED ORDER — ONDANSETRON 4 MG PO TBDP: 4.0000 mg | ORAL_TABLET | Freq: Three times a day (TID) | ORAL | Status: DC | PRN

## 2013-02-16 MED ORDER — LOPERAMIDE HCL 2 MG PO CAPS
2.0000 mg | ORAL_CAPSULE | Freq: Four times a day (QID) | ORAL | Status: DC | PRN
Start: 2013-02-16 — End: 2013-03-30

## 2013-02-16 NOTE — ED Provider Notes (Signed)
CSN: [id number]     Arrival date & time 02/16/13  1145 History   First MD Initiated Contact with Patient 02/16/13 1308     Chief Complaint  Patient presents with  . Emesis  . Diarrhea   (Consider location/radiation/quality/duration/timing/severity/associated sxs/prior Treatment) HPI Comments: 37 year old smoker female nondiabetic. Here complaining of abdominal cramping, liquid diarrhea nausea and vomiting for 3 days. Last time she vomited was earlier this morning small amounts no blood. Reports more than 5 episodes of diarrhea daily  Small amounts with no blood no melena. Denies fever or chills. States she's been closed contact with the nurse that takes care of her grandmother and had similar symptoms during the week. She is otherwise feeling well. Reports she's tolerating fluids.   Past Medical History  Diagnosis Date  . Ovarian cyst   . Fibroid    Past Surgical History  Procedure Laterality Date  . Btl    . Cryo therapy      done at age 75.  Dx with cervical cancer   Family History  Problem Relation Age of Onset  . Hypertension Maternal Grandmother   . Heart disease Maternal Grandmother   . Cancer Maternal Grandmother   . Hypertension Maternal Grandfather   . Diabetes Maternal Grandfather   . Cancer Maternal Grandfather   . Hypertension Paternal Grandmother   . Hypertension Paternal Grandfather    History  Substance Use Topics  . Smoking status: Current Every Day Smoker -- 1.00 packs/day    Types: Cigarettes  . Smokeless tobacco: Never Used  . Alcohol Use: Yes     Comment: occasionally   OB History   Grav Para Term Preterm Abortions TAB SAB Ect Mult Living   4 3 3  1  1   3      Review of Systems  Constitutional: Negative for fever, chills, diaphoresis and fatigue.  HENT: Negative for congestion and sore throat.   Respiratory: Negative for cough.   Cardiovascular: Negative for chest pain.  Gastrointestinal: Positive for nausea, vomiting and diarrhea.  Skin:  Negative for rash.  Neurological: Negative for dizziness and headaches.    Allergies  Review of patient's allergies indicates no known allergies.  Home Medications   Current Outpatient Rx  Name  Route  Sig  Dispense  Refill  . amitriptyline (ELAVIL) 50 MG tablet   Oral   Take 1 tablet (50 mg total) by mouth at bedtime.   30 tablet   2   . BIOTIN PO   Oral   Take 1 tablet by mouth daily with lunch.          Marland Kitchen CALCIUM PO   Oral   Take 1 tablet by mouth daily with lunch.         Marland Kitchen HYDROcodone-acetaminophen (NORCO/VICODIN) 5-325 MG per tablet   Oral   Take 2 tablets by mouth every 4 (four) hours as needed for pain.   20 tablet   0   . ibuprofen (ADVIL,MOTRIN) 200 MG tablet   Oral   Take 800-1,000 mg by mouth every 6 (six) hours as needed for pain.         Marland Kitchen levonorgestrel (MIRENA) 20 MCG/24HR IUD   Intrauterine   1 each by Intrauterine route once.         . loperamide (IMODIUM) 2 MG capsule   Oral   Take 1 capsule (2 mg total) by mouth 4 (four) times daily as needed for diarrhea or loose stools.   12 capsule   0   .  Multiple Vitamin (MULITIVITAMIN WITH MINERALS) TABS   Oral   Take 1 tablet by mouth daily with lunch.          . ondansetron (ZOFRAN-ODT) 4 MG disintegrating tablet   Oral   Take 1 tablet (4 mg total) by mouth every 8 (eight) hours as needed for nausea.   10 tablet   0    BP 134/92  Pulse 64  Temp(Src) 98.5 F (36.9 C) (Oral)  Resp 16  SpO2 98%  LMP 01/10/2013 Physical Exam  Nursing note and vitals reviewed. Constitutional: She is oriented to person, place, and time. She appears well-developed and well-nourished. No distress.  HENT:  Head: Normocephalic and atraumatic.  Mouth/Throat: Oropharynx is clear and moist. No oropharyngeal exudate.  Eyes: Conjunctivae are normal. No scleral icterus.  Neck: Neck supple. No thyromegaly present.  Cardiovascular: Normal rate, regular rhythm and normal heart sounds.   No murmur  heard. Pulmonary/Chest: Breath sounds normal.  Abdominal: Soft. Bowel sounds are normal. She exhibits no distension and no mass. There is no tenderness. There is no rebound and no guarding.  Lymphadenopathy:    She has no cervical adenopathy.  Neurological: She is alert and oriented to person, place, and time.  Skin: No rash noted. She is not diaphoretic.    ED Course  Procedures (including critical care time) Labs Review Labs Reviewed - No data to display Imaging Review No results found.  MDM   1. Gastroenteritis    impress viral gastritis there no signs of dehydration. Prescribed ondansetron and I Imodium. Supportive care and red flags that should prompt her return to medical attention discussed with patient and provided in writing.    Sharin Grave, MD 02/18/13 [phone]

## 2013-02-16 NOTE — ED Notes (Signed)
Pt is here with complaints of 3 days history of vomiting and diarrhea.

## 2013-03-30 ENCOUNTER — Emergency Department (HOSPITAL_COMMUNITY)
Admission: EM | Admit: 2013-03-30 | Discharge: 2013-03-30 | Disposition: A | Discharge status: Home / Self Care | Payer: BC Managed Care – PPO | Attending: Emergency Medicine | Admitting: Emergency Medicine

## 2013-03-30 ENCOUNTER — Encounter (HOSPITAL_COMMUNITY): Payer: Self-pay | Admitting: Emergency Medicine

## 2013-03-30 DIAGNOSIS — F17200 Nicotine dependence, unspecified, uncomplicated: Secondary | ICD-10-CM | POA: Insufficient documentation

## 2013-03-30 DIAGNOSIS — Z79899 Other long term (current) drug therapy: Secondary | ICD-10-CM | POA: Insufficient documentation

## 2013-03-30 DIAGNOSIS — N949 Unspecified condition associated with female genital organs and menstrual cycle: Secondary | ICD-10-CM | POA: Insufficient documentation

## 2013-03-30 DIAGNOSIS — G8929 Other chronic pain: Secondary | ICD-10-CM

## 2013-03-30 LAB — URINE MICROSCOPIC-ADD ON

## 2013-03-30 LAB — URINALYSIS, ROUTINE W REFLEX MICROSCOPIC
Glucose, UA: NEGATIVE mg/dL
Protein, ur: NEGATIVE mg/dL
Specific Gravity, Urine: 1.014 (ref 1.005–1.030)

## 2013-03-30 MED ORDER — NAPROXEN 500 MG PO TABS: 500.0000 mg | ORAL_TABLET | Freq: Two times a day (BID) | ORAL | Status: DC

## 2013-03-30 MED ORDER — KETOROLAC TROMETHAMINE 60 MG/2ML IM SOLN
60.0000 mg | Freq: Once | INTRAMUSCULAR | Status: AC
  Administered 2013-03-30: 60 mg via INTRAMUSCULAR
  Filled 2013-03-30: qty 2

## 2013-03-30 NOTE — ED Provider Notes (Signed)
CSN: [id number]     Arrival date & time 03/30/13  8295 History   First MD Initiated Contact with Patient 03/30/13 1043     Chief Complaint  Patient presents with  . Abdominal Pain   (Consider location/radiation/quality/duration/timing/severity/associated sxs/prior Treatment) HPI  Patient reports she has had pelvic pain ever since she had her tubes tied 14 years ago. She states she has had pain constantly for 14 years but it gets better or worse. She reports her pain started getting worse 3 days ago. She states she's been diagnosed with an enlarged left ovary. She had an IUD placed in June and she was also started on amitriptyline in August to see if that would help with her pain. She states she has not noticed any difference. She states the pain is sharp. She states nothing she does makes it feel worse. She states sitting with her knees flexed helps with the pain. She denies nausea, vomiting, dysuria, or frequency. She has not had a period since she had her mirena IUD placed in June. She has had mild spotting. She reports the only thing that has seemed to help is "hydro-something". She states she ran out of her hydrocodone last week. Her next appointment with her GYN is in 2 weeks. She states she's never had infection. She states her last pelvic exam was on month ago.  GYN Dr Ananias Pilgrim, GYN (note in epic she was seen by Dr Debroah Loop on 8/14).  Past Medical History  Diagnosis Date  . Ovarian cyst   . Fibroid    Past Surgical History  Procedure Laterality Date  . Btl    . Cryo therapy      done at age 20.  Dx with cervical cancer   Family History  Problem Relation Age of Onset  . Hypertension Maternal Grandmother   . Heart disease Maternal Grandmother   . Cancer Maternal Grandmother   . Hypertension Maternal Grandfather   . Diabetes Maternal Grandfather   . Cancer Maternal Grandfather   . Hypertension Paternal Grandmother   . Hypertension Paternal Grandfather    History  Substance  Use Topics  . Smoking status: Current Every Day Smoker -- 1.00 packs/day    Types: Cigarettes  . Smokeless tobacco: Never Used  . Alcohol Use: Yes     Comment: occasionally   Lives at home Lives with spouse   OB History   Grav Para Term Preterm Abortions TAB SAB Ect Mult Living   4 3 3  1  1   3      Review of Systems  All other systems reviewed and are negative.    Allergies  Review of patient's allergies indicates no known allergies.  Home Medications   Current Outpatient Rx  Name  Route  Sig  Dispense  Refill  . amitriptyline (ELAVIL) 50 MG tablet   Oral   Take 1 tablet (50 mg total) by mouth at bedtime.   30 tablet   2   . BIOTIN PO   Oral   Take 1 tablet by mouth daily with lunch.          Marland Kitchen CALCIUM PO   Oral   Take 1 tablet by mouth daily with lunch.         . levonorgestrel (MIRENA) 20 MCG/24HR IUD   Intrauterine   1 each by Intrauterine route once.         . Multiple Vitamin (MULITIVITAMIN WITH MINERALS) TABS   Oral   Take 1 tablet  by mouth daily with lunch.           BP 135/78  Pulse 50  Temp(Src) 98.3 F (36.8 C) (Oral)  Resp 18  SpO2 99%  LMP 12/28/2012  Laboratory interpretation all normal except bradycardia   Physical Exam  Nursing note and vitals reviewed. Constitutional: She is oriented to person, place, and time. She appears well-developed and well-nourished.  Non-toxic appearance. She does not appear ill. No distress.  HENT:  Head: Normocephalic and atraumatic.  Right Ear: External ear normal.  Left Ear: External ear normal.  Nose: Nose normal. No mucosal edema or rhinorrhea.  Mouth/Throat: Oropharynx is clear and moist and mucous membranes are normal. No dental abscesses or uvula swelling.  Eyes: Conjunctivae and EOM are normal. Pupils are equal, round, and reactive to light.  Neck: Normal range of motion and full passive range of motion without pain. Neck supple.  Cardiovascular: Normal rate, regular rhythm and normal  heart sounds.  Exam reveals no gallop and no friction rub.   No murmur heard. Pulmonary/Chest: Effort normal and breath sounds normal. No respiratory distress. She has no wheezes. She has no rhonchi. She has no rales. She exhibits no tenderness and no crepitus.  Abdominal: Soft. Normal appearance and bowel sounds are normal. She exhibits no distension. There is tenderness in the suprapubic area. There is no rebound and no guarding.  Musculoskeletal: Normal range of motion. She exhibits no edema and no tenderness.  Moves all extremities well.   Neurological: She is alert and oriented to person, place, and time. She has normal strength. No cranial nerve deficit.  Skin: Skin is warm, dry and intact. No rash noted. No erythema. No pallor.  Psychiatric: She has a normal mood and affect. Her speech is normal and behavior is normal. Her mood appears not anxious.    ED Course  Procedures (including critical care time)  Review of her chart shows she was seen by Dr. Debroah Loop on the 15th and he was going to refer her to a urologist for possible interstitial cystitis. When I review the [place] database she has only gotten #20 hydrocodone in July and #20 tramadol in September although patient states she has been on hydrocodone for a while.  Patient states this is her usual chronic pain, she states nothing is different. Pelvic exam was not redone.    Labs Review Results for orders placed during the hospital encounter of 03/30/13  URINALYSIS, ROUTINE W REFLEX MICROSCOPIC      Result Value Range   Color, Urine YELLOW  YELLOW   APPearance CLEAR  CLEAR   Specific Gravity, Urine 1.014  1.005 - 1.030   pH 7.5  5.0 - 8.0   Glucose, UA NEGATIVE  NEGATIVE mg/dL   Hgb urine dipstick SMALL (*) NEGATIVE   Bilirubin Urine NEGATIVE  NEGATIVE   Ketones, ur NEGATIVE  NEGATIVE mg/dL   Protein, ur NEGATIVE  NEGATIVE mg/dL   Urobilinogen, UA 0.2  0.0 - 1.0 mg/dL   Nitrite NEGATIVE  NEGATIVE   Leukocytes, UA  TRACE (*) NEGATIVE  URINE MICROSCOPIC-ADD ON      Result Value Range   Squamous Epithelial / LPF FEW (*) RARE   WBC, UA 3-6  <3 WBC/hpf   RBC / HPF 0-2  <3 RBC/hpf   Urine-Other TRICHOMONAS PRESENT     Laboratory interpretation all normal     Imaging Review No results found.  RADIOLOGY REPORT*  December 23, 2012 IMPRESSION:  5.1 x 3.3 x 5.0 cm  complex left adnexalcystic structure. The  appearance is suggestive of a hemorrhagic ovarian cyst however, to  ensure resolution, follow-up imaging in 6-12 weeks is recommended.  Small uterine fibroids.  IUD in place.  Original Report Authenticated By: Lacy Duverney, M.D.   01/31/2013 CLINICAL DATA: Lower abdominal and pelvic pain. Followup complex  left ovarian cyst. IUD.  EXAM:  TRANSVAGINAL ULTRASOUND OF PELVIS  COMPARISON: 12/23/2012  IMPRESSION:  IUD is located in endometrial cavity, but has abnormal transverse  lie as described above.  Small uterine fibroids, largest measuring 2.8 cm.  No significant change in 5 cm complex cyst in the left adnexa. This  has indeterminate but probably benign features, with differential  diagnosis including endometrioma and cystic ovarian neoplasm.  Consider continued followup with ultrasound, or pelvic MRI without  and with contrast for further characterization. This recommendation  follows the consensus statement: Management of Asymptomatic Ovarian  and Other Adnexal Cysts Imaged at [place]: Society of Radiologists in  Ultrasound Consensus Conference Statement. Radiology 2010;  [phone].  Electronically Signed  By: Myles Rosenthal  On: 01/31/2013 16:35     MDM   1. Chronic pelvic pain in female    Discharge Medication List as of 03/30/2013 12:10 PM    START taking these medications   Details  naproxen (NAPROSYN) 500 MG tablet Take 1 tablet (500 mg total) by mouth 2 (two) times daily., Starting 03/30/2013, Until Discontinued, Print        Plan discharge   Devoria Albe, MD,  Franz Dell, MD 03/30/13 1302

## 2013-03-30 NOTE — ED Notes (Addendum)
Pt states she just went to bathroom so cant void at this time. Informed to let me know when she can. Gave pt specimen cup with label on it.

## 2013-03-30 NOTE — Progress Notes (Signed)
   CARE MANAGEMENT ED NOTE 03/30/2013  Patient:  Danielle Nolan, Danielle Nolan   Account Number:  [id number]  Date Initiated:  03/30/2013  Documentation initiated by:  Edd Arbour  Subjective/Objective Assessment:   37 yr old female with bcbs  ppo coverage without pcp listed in EPIC Pt informed CM pcp is [facility]     Subjective/Objective Assessment Detail:     Action/Plan:   CM spoke with pt EPIC updated   Action/Plan Detail:   Anticipated DC Date:  03/30/2013     Status Recommendation to Physician:   Result of Recommendation:    Other ED Services  Consult Working Plan    DC Planning Services  Other  PCP issues  Outpatient Services - Pt will follow up    Choice offered to / List presented to:            Status of service:  Completed, signed off  ED Comments:   ED Comments Detail:

## 2013-03-30 NOTE — ED Notes (Signed)
Patient c/o lower abdominal pain x 2 days. Patient states she has an ovarian cyst and was unable to see her physician today. Patient was told to come to the ED. Patient denies any dysuria or vaginal discharge.

## 2013-04-30 ENCOUNTER — Emergency Department (INDEPENDENT_AMBULATORY_CARE_PROVIDER_SITE_OTHER)
Admission: EM | Admit: 2013-04-30 | Discharge: 2013-04-30 | Disposition: A | Discharge status: Home / Self Care | Payer: BC Managed Care – PPO | Source: Home / Self Care | Attending: Family Medicine | Admitting: Family Medicine

## 2013-04-30 ENCOUNTER — Encounter (HOSPITAL_COMMUNITY): Payer: Self-pay | Admitting: Emergency Medicine

## 2013-04-30 DIAGNOSIS — R6889 Other general symptoms and signs: Secondary | ICD-10-CM

## 2013-04-30 MED ORDER — FLUTICASONE PROPIONATE 50 MCG/ACT NA SUSP: 2.0000 | Freq: Every day | NASAL | Status: DC

## 2013-04-30 MED ORDER — ACETAMINOPHEN-CODEINE 300-30 MG PO TABS: 1.0000 | ORAL_TABLET | Freq: Four times a day (QID) | ORAL | Status: DC | PRN

## 2013-04-30 NOTE — ED Provider Notes (Signed)
CSN: [id number]     Arrival date & time 04/30/13  1625 History   First MD Initiated Contact with Patient 04/30/13 1721     Chief Complaint  Patient presents with  . URI   (Consider location/radiation/quality/duration/timing/severity/associated sxs/prior Treatment) HPI Patient is a 37 yo F presenting with congestion, headache, stuffiness, sore throat, mild cough, fever and diarrhea. Symptoms started 3 days ago, progressively worse. No known sick contacts. Tmax 102. She has take Tylenol, DayQuil and NyQuil. Most concerning symptom is frontal headache that she cannot seem to get rid of. No blurred vision, does not improve with blowing nose.   Past Medical History  Diagnosis Date  . Ovarian cyst   . Fibroid    Past Surgical History  Procedure Laterality Date  . Btl    . Cryo therapy      done at age 48.  Dx with cervical cancer   Family History  Problem Relation Age of Onset  . Hypertension Maternal Grandmother   . Heart disease Maternal Grandmother   . Cancer Maternal Grandmother   . Hypertension Maternal Grandfather   . Diabetes Maternal Grandfather   . Cancer Maternal Grandfather   . Hypertension Paternal Grandmother   . Hypertension Paternal Grandfather    History  Substance Use Topics  . Smoking status: Current Every Day Smoker -- 1.00 packs/day    Types: Cigarettes  . Smokeless tobacco: Never Used  . Alcohol Use: Yes     Comment: occasionally   OB History   Grav Para Term Preterm Abortions TAB SAB Ect Mult Living   4 3 3  1  1   3      Review of Systems  Constitutional: Negative for fever and chills.  HENT: Positive for congestion, postnasal drip, rhinorrhea, sinus pressure and sore throat.   Eyes: Negative for visual disturbance.  Respiratory: Positive for cough. Negative for shortness of breath.   Cardiovascular: Negative for chest pain.  Gastrointestinal: Negative for abdominal pain.  Genitourinary: Negative for dysuria.  Musculoskeletal: Negative for  arthralgias and myalgias.  Skin: Negative for rash.  Neurological: Positive for headaches.    Allergies  Review of patient's allergies indicates no known allergies.  Home Medications   Current Outpatient Rx  Name  Route  Sig  Dispense  Refill  . BIOTIN PO   Oral   Take 1 tablet by mouth daily with lunch.          Marland Kitchen CALCIUM PO   Oral   Take 1 tablet by mouth daily with lunch.         . levonorgestrel (MIRENA) 20 MCG/24HR IUD   Intrauterine   1 each by Intrauterine route once.         . Multiple Vitamin (MULITIVITAMIN WITH MINERALS) TABS   Oral   Take 1 tablet by mouth daily with lunch.          . Acetaminophen-Codeine (TYLENOL/CODEINE #3) 300-30 MG per tablet   Oral   Take 1 tablet by mouth every 6 (six) hours as needed for pain.   20 tablet   0   . amitriptyline (ELAVIL) 50 MG tablet   Oral   Take 1 tablet (50 mg total) by mouth at bedtime.   30 tablet   2   . fluticasone (FLONASE) 50 MCG/ACT nasal spray   Each Nare   Place 2 sprays into both nostrils daily.   16 g   0   . naproxen (NAPROSYN) 500 MG tablet   Oral  Take 1 tablet (500 mg total) by mouth 2 (two) times daily.   30 tablet   0    BP 122/66  Pulse 79  Temp(Src) 97.9 F (36.6 C) (Oral)  Resp 16  SpO2 100%  LMP 04/25/2013 Physical Exam  Constitutional: She is oriented to person, place, and time. She appears well-developed and well-nourished. No distress.  Appears to not feel well  HENT:  Head: Normocephalic and atraumatic.  Right Ear: External ear normal.  Left Ear: External ear normal.  Nose: Rhinorrhea present.  Mouth/Throat: No oropharyngeal exudate.  Posterior pharynx with erythema and cobblestoning. TTP of frontal sinuses  Eyes: Conjunctivae are normal. Pupils are equal, round, and reactive to light.  Neck: Normal range of motion. Neck supple.  Cardiovascular: Normal rate, regular rhythm and normal heart sounds.   No murmur heard. Pulmonary/Chest: Effort normal and  breath sounds normal. No respiratory distress. She has no wheezes.  Abdominal: Soft. She exhibits no distension. There is no tenderness.  Musculoskeletal: Normal range of motion. She exhibits no edema and no tenderness.  Lymphadenopathy:    She has no cervical adenopathy.  Neurological: She is alert and oriented to person, place, and time.  Skin: Skin is warm and dry.  Psychiatric: She has a normal mood and affect.    ED Course  Procedures (including critical care time) Labs Review Labs Reviewed - No data to display Imaging Review No results found.   MDM   1. Flu-like symptoms    Patient with URI with moderate to severe symptoms that are getting progressively worse despite OTC medications.  Tylenol with codeine for pain, fever and cough.  Flonase for nasal congestion and sinus pressure. Encouraged to rest, drink plenty of fluids and practice good hand hygiene.  Follow up with PCP if fails to improve in next 7 days.     Hilarie Fredrickson, MD 04/30/13 1823  Hilarie Fredrickson, MD 04/30/13 [phone]

## 2013-04-30 NOTE — ED Notes (Signed)
C/o cold sx for approx 4 days , has had a fever of 102 today but has taken tylenol, body aches and headache, diarrhea

## 2013-05-02 NOTE — ED Provider Notes (Signed)
Medical screening examination/treatment/procedure(s) were performed by a resident physician or non-physician practitioner and as the supervising physician I was immediately available for consultation/collaboration.  Clementeen Graham, MD    Rodolph Bong, MD 05/02/13 [phone]

## 2013-07-12 ENCOUNTER — Other Ambulatory Visit: Payer: Self-pay | Admitting: Family Medicine

## 2013-07-12 DIAGNOSIS — M545 Low back pain, unspecified: Secondary | ICD-10-CM

## 2013-07-12 DIAGNOSIS — M519 Unspecified thoracic, thoracolumbar and lumbosacral intervertebral disc disorder: Secondary | ICD-10-CM

## 2013-07-13 ENCOUNTER — Ambulatory Visit
Admission: RE | Admit: 2013-07-13 | Discharge: 2013-07-13 | Disposition: A | Discharge status: Home / Self Care | Payer: BC Managed Care – PPO | Source: Ambulatory Visit | Attending: Family Medicine | Admitting: Family Medicine

## 2013-07-13 DIAGNOSIS — M545 Low back pain, unspecified: Secondary | ICD-10-CM

## 2013-07-13 DIAGNOSIS — M519 Unspecified thoracic, thoracolumbar and lumbosacral intervertebral disc disorder: Secondary | ICD-10-CM

## 2014-04-17 ENCOUNTER — Encounter (HOSPITAL_COMMUNITY): Payer: Self-pay | Admitting: Emergency Medicine

## 2014-11-13 ENCOUNTER — Emergency Department (HOSPITAL_COMMUNITY)
Admission: EM | Admit: 2014-11-13 | Discharge: 2014-11-13 | Disposition: A | Discharge status: Home / Self Care | Payer: Self-pay | Attending: Emergency Medicine | Admitting: Emergency Medicine

## 2014-11-13 ENCOUNTER — Encounter (HOSPITAL_COMMUNITY): Payer: Self-pay | Admitting: *Deleted

## 2014-11-13 DIAGNOSIS — Z79899 Other long term (current) drug therapy: Secondary | ICD-10-CM | POA: Insufficient documentation

## 2014-11-13 DIAGNOSIS — K029 Dental caries, unspecified: Secondary | ICD-10-CM | POA: Insufficient documentation

## 2014-11-13 DIAGNOSIS — Z791 Long term (current) use of non-steroidal anti-inflammatories (NSAID): Secondary | ICD-10-CM | POA: Insufficient documentation

## 2014-11-13 DIAGNOSIS — Z7951 Long term (current) use of inhaled steroids: Secondary | ICD-10-CM | POA: Insufficient documentation

## 2014-11-13 DIAGNOSIS — Z8742 Personal history of other diseases of the female genital tract: Secondary | ICD-10-CM | POA: Insufficient documentation

## 2014-11-13 DIAGNOSIS — K047 Periapical abscess without sinus: Secondary | ICD-10-CM | POA: Insufficient documentation

## 2014-11-13 DIAGNOSIS — Z720 Tobacco use: Secondary | ICD-10-CM | POA: Insufficient documentation

## 2014-11-13 DIAGNOSIS — R05 Cough: Secondary | ICD-10-CM | POA: Insufficient documentation

## 2014-11-13 DIAGNOSIS — Z86018 Personal history of other benign neoplasm: Secondary | ICD-10-CM | POA: Insufficient documentation

## 2014-11-13 MED ORDER — PENICILLIN V POTASSIUM 500 MG PO TABS: 500.0000 mg | ORAL_TABLET | Freq: Four times a day (QID) | ORAL | Status: AC

## 2014-11-13 MED ORDER — HYDROCODONE-ACETAMINOPHEN 5-325 MG PO TABS: 1.0000 | ORAL_TABLET | ORAL | Status: DC | PRN

## 2014-11-13 MED ORDER — IBUPROFEN 800 MG PO TABS: 800.0000 mg | ORAL_TABLET | Freq: Three times a day (TID) | ORAL | Status: DC | PRN

## 2014-11-13 NOTE — ED Notes (Signed)
Pt in c/o dental pain and possible dental abscess, denies fever, no distress noted

## 2014-11-13 NOTE — ED Provider Notes (Signed)
CSN: [id number]     Arrival date & time 11/13/14  1222 History  This chart was scribed for non-physician practitioner, Clayton Bibles, PA-C, working with Daleen Bo, MD by Ladene Artist, ED Scribe. This patient was seen in room TR07C/TR07C and the patient's care was started at 1:03 PM.   Chief Complaint  Patient presents with  . Dental Pain   The history is provided by the patient. No language interpreter was used.   HPI Comments: Danielle Nolan is a 39 y.o. female who presents to the Emergency Department complaining of gradually worsening, constant left lower dental pain that radiates into left ear and throat onset 2-3 days ago. Pt reports intermittent dental pain for a while that she has not been able to see a dentist for. She reports associated left-sided facial swelling and pus from the tooth. Pt also reports a productive cough. No medications tried PTA. Pt denies chills, body aches, difficulty swallowing, SOB, abdominal pain, vomiting, diarrhea.   Past Medical History  Diagnosis Date  . Ovarian cyst   . Fibroid    Past Surgical History  Procedure Laterality Date  . Btl    . Cryo therapy      done at age 22.  Dx with cervical cancer   Family History  Problem Relation Age of Onset  . Hypertension Maternal Grandmother   . Heart disease Maternal Grandmother   . Cancer Maternal Grandmother   . Hypertension Maternal Grandfather   . Diabetes Maternal Grandfather   . Cancer Maternal Grandfather   . Hypertension Paternal Grandmother   . Hypertension Paternal Grandfather    History  Substance Use Topics  . Smoking status: Current Every Day Smoker -- 1.00 packs/day    Types: Cigarettes  . Smokeless tobacco: Never Used  . Alcohol Use: Yes     Comment: occasionally   OB History    Gravida Para Term Preterm AB TAB SAB Ectopic Multiple Living   4 3 3  1  1   3      Review of Systems  Constitutional: Negative for fever and chills.  HENT: Positive for dental problem and facial  swelling. Negative for sore throat and trouble swallowing.   Respiratory: Positive for cough. Negative for shortness of breath.   Gastrointestinal: Negative for vomiting, abdominal pain and diarrhea.  Musculoskeletal: Negative for myalgias, neck pain and neck stiffness.  Skin: Negative for color change.  Allergic/Immunologic: Negative for immunocompromised state.  Psychiatric/Behavioral: Negative for self-injury.   Allergies  Review of patient's allergies indicates no known allergies.  Home Medications   Prior to Admission medications   Medication Sig Start Date End Date Taking? Authorizing Provider  Acetaminophen-Codeine (TYLENOL/CODEINE #3) 300-30 MG per tablet Take 1 tablet by mouth every 6 (six) hours as needed for pain. 04/30/13   Amber Fidel Levy, MD  amitriptyline (ELAVIL) 50 MG tablet Take 1 tablet (50 mg total) by mouth at bedtime. 01/27/13   Woodroe Mode, MD  BIOTIN PO Take 1 tablet by mouth daily with lunch.     Historical Provider, MD  CALCIUM PO Take 1 tablet by mouth daily with lunch.    Historical Provider, MD  fluticasone (FLONASE) 50 MCG/ACT nasal spray Place 2 sprays into both nostrils daily. 04/30/13   Amber Fidel Levy, MD  levonorgestrel (MIRENA) 20 MCG/24HR IUD 1 each by Intrauterine route once.    Historical Provider, MD  Multiple Vitamin (MULITIVITAMIN WITH MINERALS) TABS Take 1 tablet by mouth daily with lunch.     Historical Provider,  MD  naproxen (NAPROSYN) 500 MG tablet Take 1 tablet (500 mg total) by mouth 2 (two) times daily. 03/30/13   Rolland Porter, MD   Triage: BP 152/72 mmHg  Pulse 54  Temp(Src) 98.5 F (36.9 C) (Oral)  Resp 20  SpO2 100% Physical Exam  Constitutional: She appears well-developed and well-nourished. No distress.  HENT:  Head: Normocephalic and atraumatic.  Mouth/Throat: Uvula is midline and oropharynx is clear and moist. Mucous membranes are not dry. No uvula swelling. No oropharyngeal exudate, posterior oropharyngeal edema, posterior  oropharyngeal erythema or tonsillar abscesses.  L lower first molar with severe decay to gumline. L second molar space with area of fluctuance associated with edema to L mandible.  Neck: Trachea normal, normal range of motion and phonation normal. Neck supple. No tracheal tenderness present. No rigidity. No tracheal deviation, no edema, no erythema and normal range of motion present.  Cardiovascular: Normal rate.   Pulmonary/Chest: Effort normal and breath sounds normal. No stridor.  Lymphadenopathy:    She has no cervical adenopathy.  Neurological: She is alert.  Skin: She is not diaphoretic.  Nursing note and vitals reviewed.   ED Course  Procedures (including critical care time) DIAGNOSTIC STUDIES: Oxygen Saturation is 100% on RA, normal by my interpretation.    COORDINATION OF CARE: 1:06 PM-Discussed treatment plan which includes 800 mg ibuprofen, Norco and Veetid with pt at bedside and pt agreed to plan.   Labs Review Labs Reviewed - No data to display  Imaging Review No results found.   EKG Interpretation None      MDM   Final diagnoses:  Dental abscess    Afebrile, nontoxic patient with left lower dental pain with likely abscess.  No airway concerns.  Doubt ludwig's angina.   D/C home with penicillin, ibuprofen, norco, dental follow up.  Resources given.  There is no dental or oral surgeon on call today.  Discussed result, findings, treatment, and follow up  with patient.  Pt given return precautions.  Pt verbalizes understanding and agrees with plan.       I personally performed the services described in this documentation, which was scribed in my presence. The recorded information has been reviewed and is accurate.    Clayton Bibles, PA-C 11/13/14 [place], MD 11/13/14 [phone]

## 2014-11-13 NOTE — Discharge Instructions (Signed)
Read the information below.  Use the prescribed medication as directed.  Please discuss all new medications with your pharmacist.  Do not take additional tylenol while taking the prescribed pain medication to avoid overdose.  You may return to the Emergency Department at any time for worsening condition or any new symptoms that concern you.   If there is any possibility that you might be pregnant, please let your health care provider know and discuss this with the pharmacist to ensure medication safety.   Please call your dentist to schedule a close follow up appointment.  If you develop fevers, swelling in your face, difficulty swallowing or breathing, return to the ER immediately for a recheck.    Dental Abscess A dental abscess is a collection of infected fluid (pus) from a bacterial infection in the inner part of the tooth (pulp). It usually occurs at the end of the tooth's root.  CAUSES   Severe tooth decay.  Trauma to the tooth that allows bacteria to enter into the pulp, such as a broken or chipped tooth. SYMPTOMS   Severe pain in and around the infected tooth.  Swelling and redness around the abscessed tooth or in the mouth or face.  Tenderness.  Pus drainage.  Bad breath.  Bitter taste in the mouth.  Difficulty swallowing.  Difficulty opening the mouth.  Nausea.  Vomiting.  Chills.  Swollen neck glands. DIAGNOSIS   A medical and dental history will be taken.  An examination will be performed by tapping on the abscessed tooth.  X-rays may be taken of the tooth to identify the abscess. TREATMENT The goal of treatment is to eliminate the infection. You may be prescribed antibiotic medicine to stop the infection from spreading. A root canal may be performed to save the tooth. If the tooth cannot be saved, it may be pulled (extracted) and the abscess may be drained.  HOME CARE INSTRUCTIONS  Only take over-the-counter or prescription medicines for pain, fever, or  discomfort as directed by your caregiver.  Rinse your mouth (gargle) often with salt water ( tsp salt in 8 oz [250 ml] of warm water) to relieve pain or swelling.  Do not drive after taking pain medicine (narcotics).  Do not apply heat to the outside of your face.  Return to your dentist for further treatment as directed. SEEK MEDICAL CARE IF:  Your pain is not helped by medicine.  Your pain is getting worse instead of better. SEEK IMMEDIATE MEDICAL CARE IF:  You have a fever or persistent symptoms for more than 2-3 days.  You have a fever and your symptoms suddenly get worse.  You have chills or a very bad headache.  You have problems breathing or swallowing.  You have trouble opening your mouth.  You have swelling in the neck or around the eye. Document Released: 06/02/2005 Document Revised: 02/25/2012 Document Reviewed: 09/10/2010 [facility] Patient Information 2015 [place], [place]. This information is not intended to replace advice given to you by your health care provider. Make sure you discuss any questions you have with your health care provider.    Emergency Department Resource Guide 1) Find a Doctor and Pay Out of Pocket Although you won't have to find out who is covered by your insurance plan, it is a good idea to ask around and get recommendations. You will then need to call the office and see if the doctor you have chosen will accept you as a new patient and what types of options they offer  for patients who are self-pay. Some doctors offer discounts or will set up payment plans for their patients who do not have insurance, but you will need to ask so you aren't surprised when you get to your appointment.  2) Contact Your Local Health Department Not all health departments have doctors that can see patients for sick visits, but many do, so it is worth a call to see if yours does. If you don't know where your local health department is, you can check in your phone book.  The CDC also has a tool to help you locate your state's health department, and many state websites also have listings of all of their local health departments.  3) Find a [facility] If your illness is not likely to be very severe or complicated, you may want to try a walk in clinic. These are popping up all over the country in pharmacies, drugstores, and shopping centers. They're usually staffed by nurse practitioners or physician assistants that have been trained to treat common illnesses and complaints. They're usually fairly quick and inexpensive. However, if you have serious medical issues or chronic medical problems, these are probably not your best option.  No Primary Care Doctor: - Call Health Connect at  [phone] - they can help you locate a primary care doctor that  accepts your insurance, provides certain services, etc. - Physician Referral Service- [phone]  Chronic Pain Problems: Organization         Address  Phone   Notes  [facility]  [phone] Patients need to be referred by their primary care doctor.   Medication Assistance: Organization         Address  Phone   Notes  [facility] Medication [facility] [address] [place], [place] [address] [phone] --Must be a resident of [facility] -- Must have NO insurance coverage whatsoever (no Medicaid/ Medicare, etc.) -- The pt. MUST have a primary care doctor that directs their care regularly and follows them in the community   MedAssist  [phone]   [employer]  [phone]    Agencies that provide inexpensive medical care: Organization         Address  Phone   Notes  [place]  [phone]   [facility]    [phone]   [facility] [place], [place] [address] [phone]   [place] [address] [place] [phone]   Planned Parenthood     [phone]   [facility]    [phone]   [place] and [place] [address], [place] Phone:  [phone], Fax:  [phone] Hours of Operation:  9 am - 6 pm, M-F.  Also accepts Medicaid/Medicare and self-pay.  [facility] for [place], [address], [place] Phone: [phone], Fax: [phone]. Hours of Operation:  8:30 am - 5:30 pm, M-F.  Also accepts Medicaid and self-pay.  [facility] [address], [place] Phone: [phone]   [place], [place], [place] [phone], Ext. [phone] Mondays & Thursdays: 7-9 AM.  First 15 patients are seen on a first come, first serve basis.    [place] Providers:  Organization         Address  Phone   Notes  [employer]  [address], [place] [phone] Also accepts self-pay patients.  [facility] [address], [place]  [phone]   [place], [address], [place] [phone]   [facility] [address] [place] [phone]   Lucianne Lei [address], [place]   [phone] Only accepts [place] Access [place] patients after they have their name applied to their card.   Self-Pay (no insurance) in [facility]:  Organization         Address  Phone   Notes  Sickle Cell Patients, [facility] [place] [phone]   [facility] [place] [phone]   [facility] [place]  [place], [place], [place] [phone]   [facility]/Dr. Osei-Bonsu  [address], [place] or [address], [place] [phone] Phone number for both [place] and [place] locations is the same.  Urgent Medical and  [facility] [address] [place] [phone]   [facility] [address], [place] or [address] [phone]   [facility] [address], [place] [phone], phone; [phone], fax Sees patients 1st and 3rd Saturday of every month.  Must not qualify for public or private insurance (i.e. Medicaid, Medicare, [place] Health Choice, Veterans' Benefits)  Household income should be no more than 200% of the poverty level The clinic cannot treat you if you are pregnant or think you are pregnant  Sexually transmitted diseases are not treated at the clinic.    Dental Care: Organization         Address  Phone  Notes  [facility] [place] Department of [facility] [place] [phone] Accepts children up to age 21 who are enrolled in [place] or [place]; pregnant women with a Medicaid card; and children who have applied for Medicaid or [place] Health Choice, but were declined, whose parents can pay a reduced fee at time of service.  [facility] Department of [facility] [place]  [address], [place] [phone] Accepts children up to age 12 who are enrolled in [place] or [place]; pregnant women with a Medicaid card; and children who have applied for Medicaid or [place] Health Choice, but were declined, whose parents can pay a reduced fee at time of service.  [facility] Access [facility]  Bathsheba Durrett [place] [phone] Patients are seen by appointment only. Walk-ins are not accepted. [place] will see patients 26 years of age and older. Monday - Tuesday (8am-5pm) Most Wednesdays (8:30-5pm) $30 per visit, cash only  [facility] Adult [facility] Access [facility]  [address], [facility] [phone] Patients are seen by appointment only. Walk-ins are not accepted. [place] will see patients 27 years of age and  older. One Wednesday Evening (Monthly: Volunteer Based).  $30 per visit, cash only  [place]  [phone] for adults; Children under age 89, call Graduate Pediatric Dentistry at [phone]. Children aged 72-14, please call [phone] to request a pediatric application.  Dental services are provided in all areas of dental care including fillings, crowns and bridges, complete and partial dentures, implants, gum  treatment, root canals, and extractions. Preventive care is also provided. Treatment is provided to both adults and children. Patients are selected via a lottery and there is often a waiting list.   [facility] [address], [place]  [phone] [email or web address]   [facility] [address] [place], [place] [phone], Ext. [phone] Second and Fourth Thursday of each month, opens at 6:30 AM; Clinic ends at 9 AM.  Patients are seen on a first-come first-served basis, and a limited number are seen during each clinic.   [facility]  [address] Hillard Danker [place], [place] [phone]   Eligibility Requirements You must have lived in [place], [place], or [place] counties for at least the last three months.   You cannot be eligible for state or federal sponsored [employer], including [employer], [place], or [employer].   You generally cannot be eligible for healthcare insurance through your employer.    How to apply: Eligibility screenings are held every Tuesday and Wednesday afternoon from 1:00 pm until 4:00 pm. You do not need an appointment for the interview!  [facility] [address] [place], [place]   [place]  [place] Department  [place]  [phone]    Behavioral Health Resources in the Community: Intensive Outpatient Programs Organization          Address  Phone  Notes  [place]. [address], [place], [place] [phone]   [facility] Outpatient [address], [place], [place]   ADS: Alcohol [facility] Drug [facility] [address] [place], [place]   [place] [address],  [place], [place] or [phone]   Substance Abuse Resources Organization         Address  Phone  Notes  Alcohol and Drug Services  [phone]   [place]  [phone]   The [place]   Chinita Pester  [phone]   Residential & Outpatient Substance Abuse Program  [phone]   Psychological Services Organization         Address  Phone  Notes  [facility]  [place]  [phone]   [place] [address], [place] or [phone]    Mobile Crisis Teams Organization         Address  Phone  Notes  [facility], Mobile Crisis Care Unit  [phone]   [facility]  [address] [place], [place]   [place] Levels [address] [place] [phone]    Self-Help/Support Groups Organization         Address  Phone             Notes  [place]. of [place] - variety of support groups  [place] Call for more information  Narcotics Anonymous (NA), [facility] [address], [employer] [place]  2 meetings at this location   Special educational needs teacher         Address  Phone  Notes  [facility]idential [facility] [place],    [place]  [place]  [address] [place], [place], [place]   [place], [place] [phone] Admissions: 8am-3pm M-F  [facility] Substance Abuse Treatment  Center [address]    [place], [place] [phone]   [facility]  [facility] [address] [place], [place], [place]   [facility] Addictive Med [address]  [place], [place]   [facility] - Intensive Outpatient [address] Kristeen [address], [place], [place]   [facility] [place].,  [place], [place] [phone] or [phone]   [facility] Services (RTS) [address] [place], [place] Accepts Medicaid  [facility] [place] [address]  [place] [phone] Substance Abuse/Addiction Treatment   [facility] Organization         Address  Phone  Notes  [facility]  [phone]   Domenic Schwab, PhD [address] [place], [place]   [phone] or [phone]   [place] Langlois Abbott [place], [place] [phone]   [facility] [address] [place], [place] [phone] Insurance/Medicaid/sponsorship through [facility] and Families [address] [place]                                    [place] [employer], [place] [phone] [place] [address], [place] [phone]    Dr. Adele Schilder  [phone]   [facility] Dept. 1) [address] [place] 2) [place] 3)  [address], [place] [phone]  [phone]   [place] [phone] or [phone] (After Hours)

## 2014-12-14 IMAGING — US US TRANSVAGINAL NON-OB
1 series · 13 of 25 positions shown · non-contrast
Comparison: 12/23/2012

CLINICAL DATA: Lower abdominal and pelvic pain. Followup complex
left ovarian cyst. IUD.

EXAM:
TRANSVAGINAL ULTRASOUND OF PELVIS
TECHNIQUE: Transvaginal ultrasound examination of the pelvis was performed
including evaluation of the uterus, ovaries, adnexal regions, and
pelvic cul-de-sac.

[Series 1: us transvaginal non-ob · 46 acquisitions, 13 frames shown]
[im 1/46]
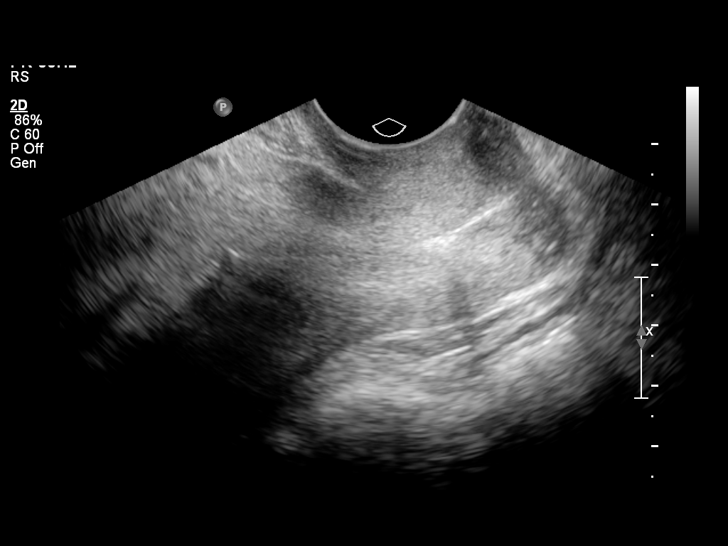
[im 4/46]
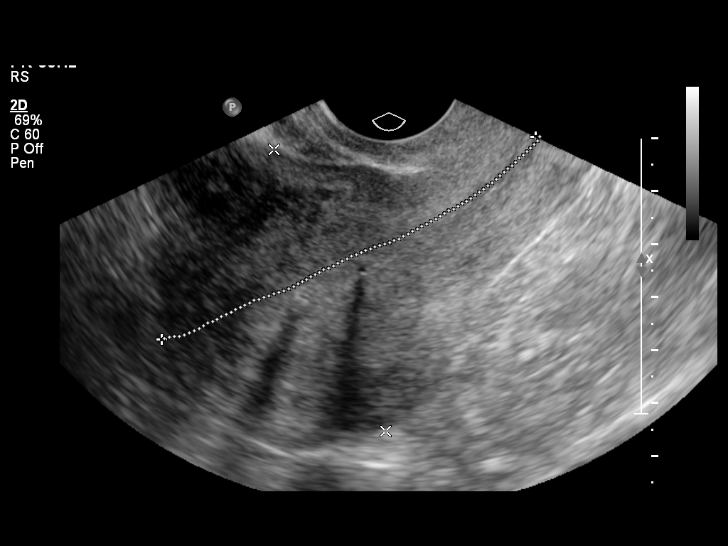
[im 8/46]
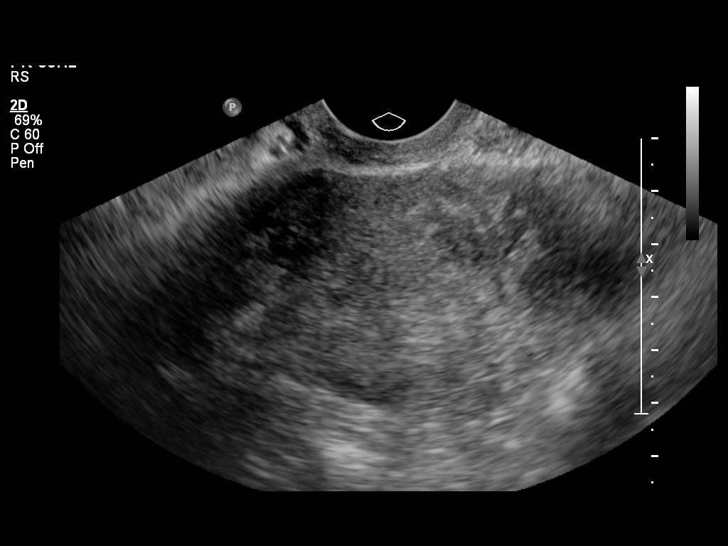
[im 12/46]
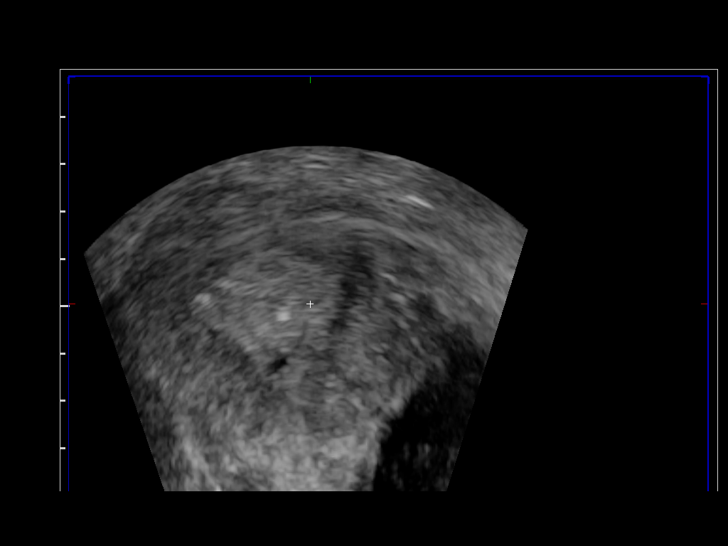
[im 16/46]
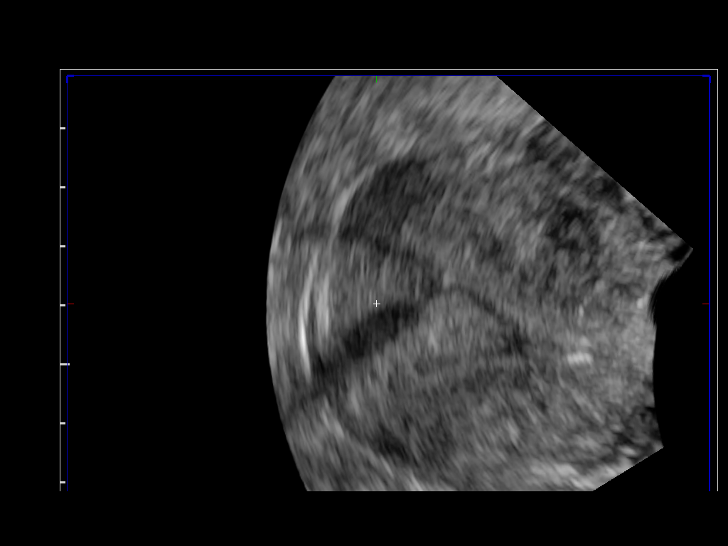
[im 19/46]
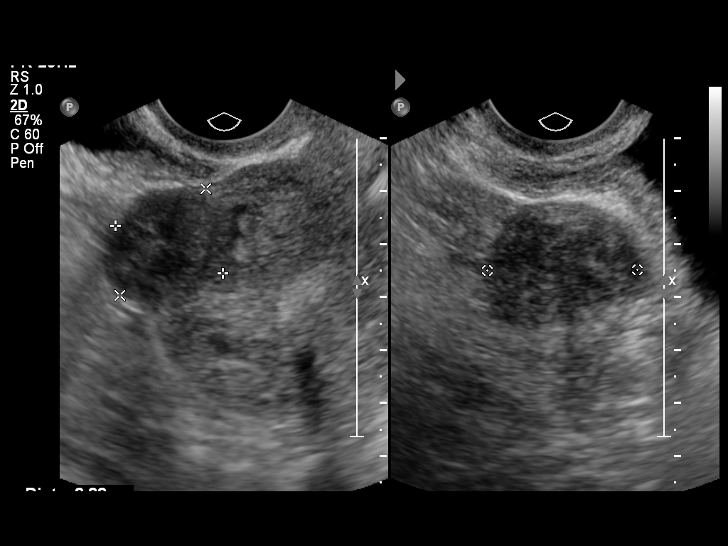
[im 23/46]
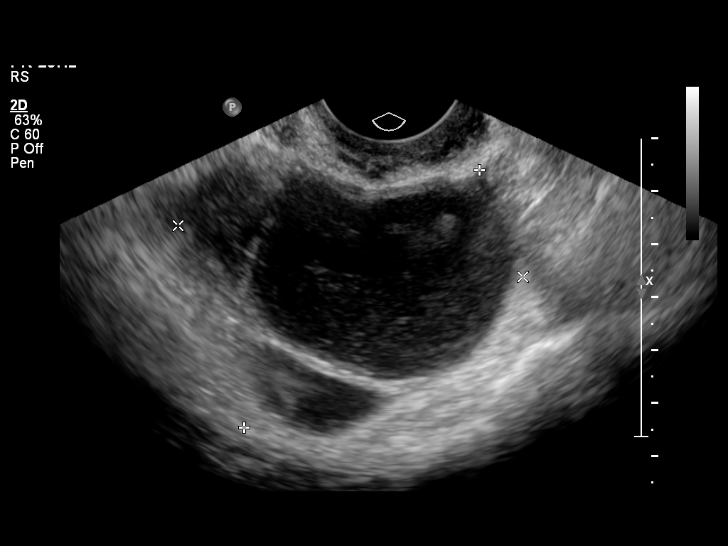
[im 27/46]
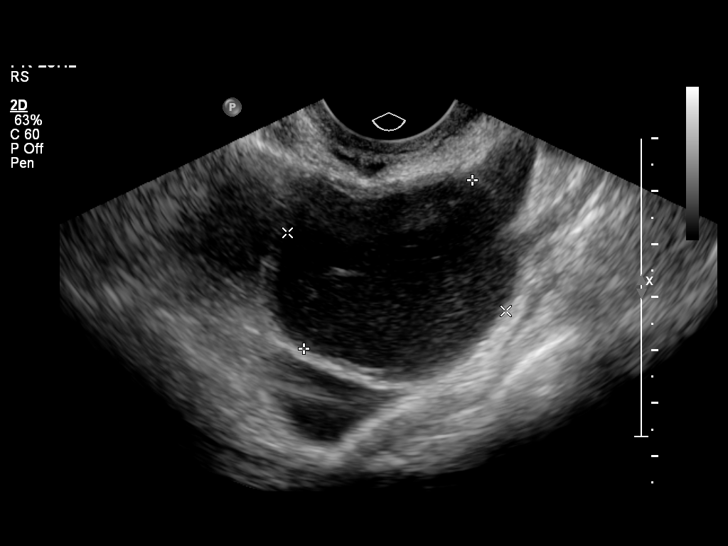
[im 31/46]
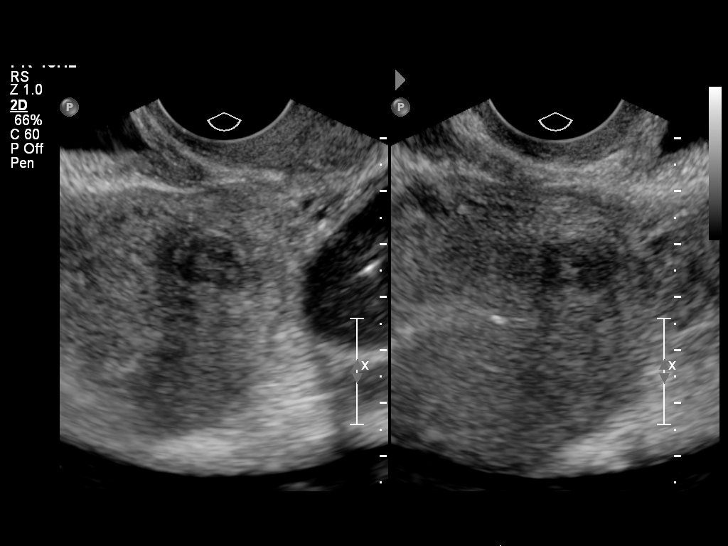
[im 34/46]
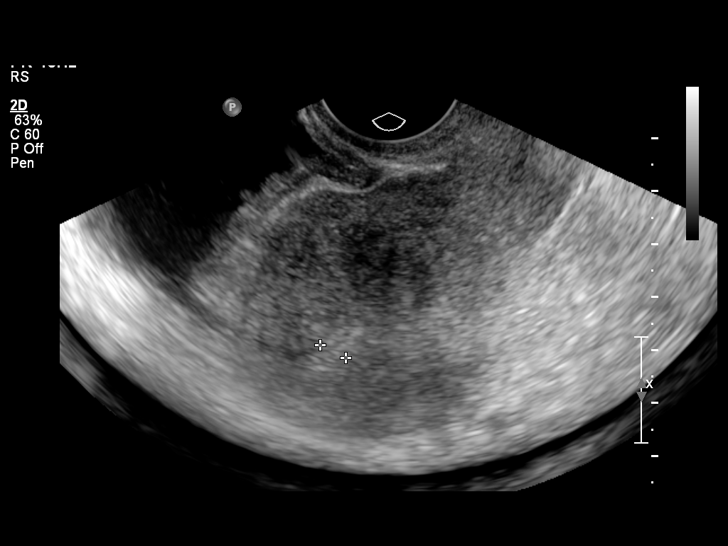
[im 38/46]
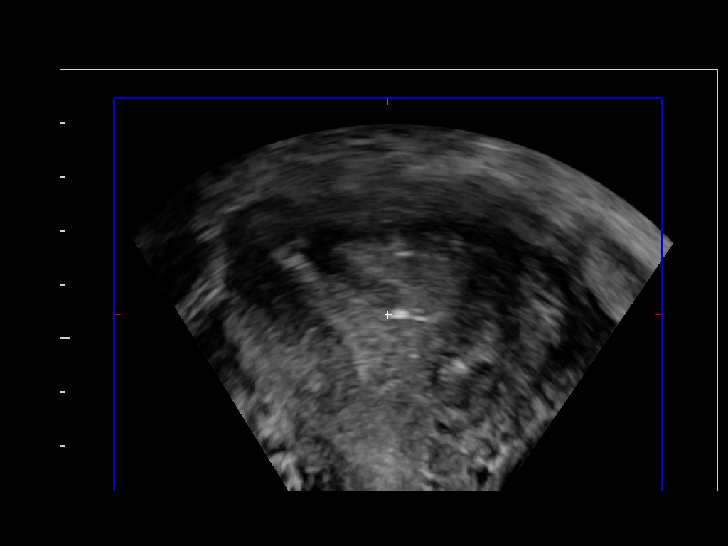
[im 42/46]
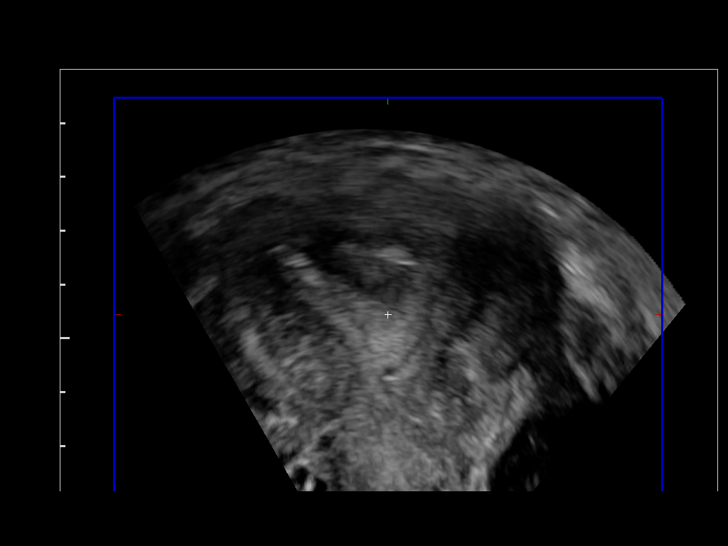
[im 46/46]
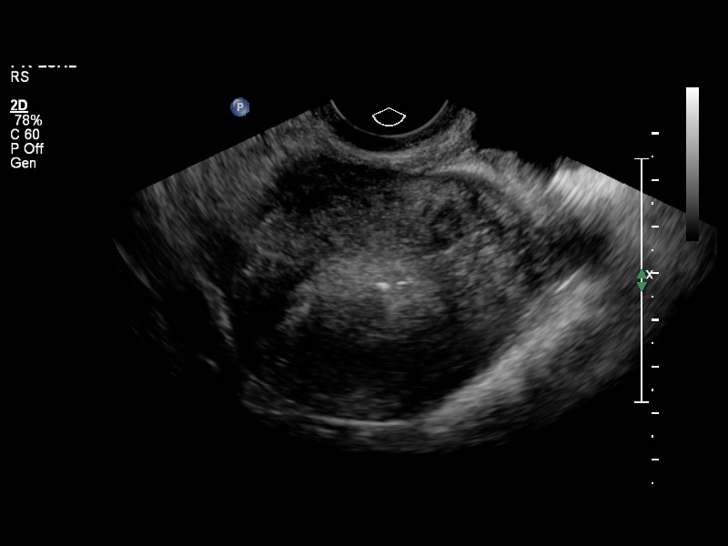

[13 of 25 positions shown; findings below may reference images not displayed]

FINDINGS: Uterus: 8.3 x 5.7 x 6.7 cm. A subserosal fibroid is again seen in
the uterine fundus measuring 2.8 cm in maximum diameter. A smaller
fibroid is again seen in the lower uterine segment measuring 1.7 cm
in maximum diameter.

Endometrium: Double layer thickness measures 5 mm on transvaginal
sonography. IUD is seen in the endometrial cavity. Using 3D volume
imaging, the IUD appears abnormal in position, lying transversely
within the endometrial cavity, with the upper-most T portion along
the left lateral wall and the lower IUD and in the right cornual
region.

Right ovary: 3.0 x 2.1 x 2.2 cm.  Normal appearance.

Left ovary: A complex cyst containing low level internal echoes and
a thin septation is again seen in the left adnexa. This measures
x 4.5 x 4.4 cm, which is not significant changed since previous
study. This has indeterminate but probably benign features.
Differential diagnosis includes endometrioma and cystic ovarian
neoplasm.

Other findings:  No free fluid
IMPRESSION: IUD is located in endometrial cavity, but has abnormal transverse
lie as described above.

Small uterine fibroids, largest measuring 2.8 cm.

No significant change in 5 cm complex cyst in the left adnexa. This
has indeterminate but probably benign features, with differential
diagnosis including endometrioma and cystic ovarian neoplasm.
Consider continued followup with ultrasound, or pelvic MRI without
and with contrast for further characterization. This recommendation
follows the consensus statement: Management of Asymptomatic Ovarian
and Other Adnexal Cysts Imaged at US: Society of Radiologists in
Ultrasound Consensus Conference Statement. Radiology 3626;

## 2015-10-18 ENCOUNTER — Emergency Department (HOSPITAL_COMMUNITY): Payer: Self-pay

## 2015-10-18 ENCOUNTER — Encounter (HOSPITAL_COMMUNITY): Payer: Self-pay | Admitting: Emergency Medicine

## 2015-10-18 ENCOUNTER — Emergency Department (HOSPITAL_COMMUNITY)
Admission: EM | Admit: 2015-10-18 | Discharge: 2015-10-18 | Disposition: A | Discharge status: Home / Self Care | Payer: Self-pay | Attending: Emergency Medicine | Admitting: Emergency Medicine

## 2015-10-18 DIAGNOSIS — Z7951 Long term (current) use of inhaled steroids: Secondary | ICD-10-CM | POA: Insufficient documentation

## 2015-10-18 DIAGNOSIS — Z8742 Personal history of other diseases of the female genital tract: Secondary | ICD-10-CM | POA: Insufficient documentation

## 2015-10-18 DIAGNOSIS — J159 Unspecified bacterial pneumonia: Secondary | ICD-10-CM | POA: Insufficient documentation

## 2015-10-18 DIAGNOSIS — Z791 Long term (current) use of non-steroidal anti-inflammatories (NSAID): Secondary | ICD-10-CM | POA: Insufficient documentation

## 2015-10-18 DIAGNOSIS — R42 Dizziness and giddiness: Secondary | ICD-10-CM | POA: Insufficient documentation

## 2015-10-18 DIAGNOSIS — Z79899 Other long term (current) drug therapy: Secondary | ICD-10-CM | POA: Insufficient documentation

## 2015-10-18 DIAGNOSIS — Z86018 Personal history of other benign neoplasm: Secondary | ICD-10-CM | POA: Insufficient documentation

## 2015-10-18 DIAGNOSIS — R079 Chest pain, unspecified: Secondary | ICD-10-CM | POA: Insufficient documentation

## 2015-10-18 DIAGNOSIS — H538 Other visual disturbances: Secondary | ICD-10-CM | POA: Insufficient documentation

## 2015-10-18 DIAGNOSIS — F17210 Nicotine dependence, cigarettes, uncomplicated: Secondary | ICD-10-CM | POA: Insufficient documentation

## 2015-10-18 DIAGNOSIS — J189 Pneumonia, unspecified organism: Secondary | ICD-10-CM

## 2015-10-18 DIAGNOSIS — R062 Wheezing: Secondary | ICD-10-CM

## 2015-10-18 DIAGNOSIS — R51 Headache: Secondary | ICD-10-CM | POA: Insufficient documentation

## 2015-10-18 LAB — RAPID STREP SCREEN (MED CTR MEBANE ONLY): Streptococcus, Group A Screen (Direct): NEGATIVE

## 2015-10-18 MED ORDER — AZITHROMYCIN 250 MG PO TABS: ORAL_TABLET | ORAL | Status: DC

## 2015-10-18 MED ORDER — ALBUTEROL SULFATE HFA 108 (90 BASE) MCG/ACT IN AERS
2.0000 | INHALATION_SPRAY | Freq: Four times a day (QID) | RESPIRATORY_TRACT | Status: DC | PRN
  Administered 2015-10-18: 2 via RESPIRATORY_TRACT
  Filled 2015-10-18: qty 6.7

## 2015-10-18 MED ORDER — IPRATROPIUM-ALBUTEROL 0.5-2.5 (3) MG/3ML IN SOLN
3.0000 mL | Freq: Once | RESPIRATORY_TRACT | Status: AC
  Administered 2015-10-18: 3 mL via RESPIRATORY_TRACT
  Filled 2015-10-18: qty 3

## 2015-10-18 NOTE — ED Provider Notes (Signed)
CSN: [id number]     Arrival date & time 10/18/15  [id number] History   First MD Initiated Contact with Patient 10/18/15 [phone]     Chief Complaint  Patient presents with  . URI     (Consider location/radiation/quality/duration/timing/severity/associated sxs/prior Treatment) HPI Comments: Danielle Nolan is a 40 y.o. female presents to ED with complaint of sore throat, cough, subjective fevers, and shortness of breath. Symptoms started on Saturday with sore throat and have progressively worsened. Cough is intermittently productive with yellow sputum, no hemoptysis. She is short of breath at rest and with exertion and has associated wheezing. Endorses chest tightness and worsening chest discomfort with deep inhalation and cough. Subjective fevers on and off. Additional symptoms include headache and dizziness. Denies muscle aches and pains. No sinus congestion, rhinorrhea, ear pain/pressure, or eye discharge. Denies history of asthma or COPD. Smoke daily. 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 Denies leg swelling/pain, h/o cancer at 40 years of age but no active cancer treatment, no hemoptysis, no recent long distance travel/surgery/immobilization.   Patient is a 40 y.o. female presenting with URI. The history is provided by the patient.  URI Presenting symptoms: cough, fever and sore throat   Presenting symptoms: no congestion, no ear pain and no rhinorrhea   Onset quality:  Sudden Relieved by:  None tried Associated symptoms: headaches and wheezing   Associated symptoms: no arthralgias, no myalgias and no sneezing     Past Medical History  Diagnosis Date  . Ovarian cyst   . Fibroid    Past Surgical History  Procedure Laterality Date  . Btl    . Cryo therapy      done at age 62.  Dx with cervical cancer   Family History  Problem Relation Age of Onset  . Hypertension Maternal Grandmother   . Heart disease Maternal Grandmother   . Cancer  Maternal Grandmother   . Hypertension Maternal Grandfather   . Diabetes Maternal Grandfather   . Cancer Maternal Grandfather   . Hypertension Paternal Grandmother   . Hypertension Paternal Grandfather    Social History  Substance Use Topics  . Smoking status: Current Every Day Smoker -- 1.00 packs/day    Types: Cigarettes  . Smokeless tobacco: Never Used  . Alcohol Use: Yes     Comment: occasionally   OB History    Gravida Para Term Preterm AB TAB SAB Ectopic Multiple Living   4 3 3  1  1   3      Review of Systems  Constitutional: Positive for fever. Negative for chills and diaphoresis.  HENT: Positive for sore throat and voice change. Negative for congestion, ear discharge, ear pain, postnasal drip, rhinorrhea, sinus pressure and sneezing.   Eyes: Positive for visual disturbance. Negative for discharge.  Respiratory: Positive for cough, chest tightness, shortness of breath and wheezing.   Cardiovascular: Positive for chest pain ( with cough and deep inspiration). Negative for palpitations and leg swelling.  Musculoskeletal: Negative for myalgias and arthralgias.  Neurological: Positive for dizziness, light-headedness and headaches. Negative for syncope.      Allergies  Review of patient's allergies indicates no known allergies.  Home Medications   Prior to Admission medications   Medication Sig Start Date End Date Taking? Authorizing Provider  levonorgestrel (MIRENA) 20 MCG/24HR IUD 1 each by Intrauterine route once.   Yes Historical Provider, MD  Acetaminophen-Codeine (TYLENOL/CODEINE #3) 300-30 MG per tablet Take 1 tablet by mouth every 6 (six)  hours as needed for pain. 04/30/13   Amber Fidel Levy, MD  amitriptyline (ELAVIL) 50 MG tablet Take 1 tablet (50 mg total) by mouth at bedtime. 01/27/13   Woodroe Mode, MD  azithromycin (ZITHROMAX Z-PAK) 250 MG tablet On the first day, take two tabs. On days 2-5, take one tab. 10/18/15   Roxanna Mew, PA-C  BIOTIN PO Take 1  tablet by mouth daily with lunch.     Historical Provider, MD  CALCIUM PO Take 1 tablet by mouth daily with lunch.    Historical Provider, MD  fluticasone (FLONASE) 50 MCG/ACT nasal spray Place 2 sprays into both nostrils daily. 04/30/13   Amber Fidel Levy, MD  HYDROcodone-acetaminophen (NORCO/VICODIN) 5-325 MG per tablet Take 1 tablet by mouth every 4 (four) hours as needed for severe pain. 11/13/14   Clayton Bibles, PA-C  ibuprofen (ADVIL,MOTRIN) 800 MG tablet Take 1 tablet (800 mg total) by mouth every 8 (eight) hours as needed for mild pain or moderate pain. 11/13/14   Clayton Bibles, PA-C  Multiple Vitamin (MULITIVITAMIN WITH MINERALS) TABS Take 1 tablet by mouth daily with lunch.     Historical Provider, MD  naproxen (NAPROSYN) 500 MG tablet Take 1 tablet (500 mg total) by mouth 2 (two) times daily. 03/30/13   Rolland Porter, MD   BP 145/87 mmHg  Pulse 72  Temp(Src) 97.9 F (36.6 C) (Oral)  Resp 18  Ht 5\' 7"  (1.702 m)  Wt 80.287 kg  BMI 27.72 kg/m2  SpO2 100% Physical Exam  Constitutional: She appears well-developed and well-nourished.  HENT:  Head: Normocephalic and atraumatic.  Right Ear: Tympanic membrane, external ear and ear canal normal.  Left Ear: Tympanic membrane, external ear and ear canal normal.  Nose: No mucosal edema, rhinorrhea or septal deviation. No epistaxis.  No foreign bodies. Right sinus exhibits no maxillary sinus tenderness and no frontal sinus tenderness. Left sinus exhibits no maxillary sinus tenderness and no frontal sinus tenderness.  Mouth/Throat: Uvula is midline and mucous membranes are normal. Posterior oropharyngeal erythema ( mild) present. No oropharyngeal exudate.  Eyes: Conjunctivae and EOM are normal. Pupils are equal, round, and reactive to light. Right eye exhibits no discharge. Left eye exhibits no discharge. No scleral icterus.  Neck: Normal range of motion. Neck supple.  Cardiovascular: Normal rate, normal heart sounds and intact distal pulses.   No murmur  heard. Pulmonary/Chest: She has wheezes. She exhibits no tenderness.  Patient is labored in her breathing, able to speak a few words between breaths. No accessory muscle use noted. Diffuse wheezing and adventitious breath sounds heard on auscultation.   Musculoskeletal: Normal range of motion. She exhibits no edema ( lower extremities) or tenderness ( lower extremities).  Lymphadenopathy:    She has no cervical adenopathy.  Neurological: She is alert. Coordination normal.  Skin: Skin is warm and dry. She is not diaphoretic.  Psychiatric: She has a normal mood and affect.    ED Course  Procedures (including critical care time) Labs Review Labs Reviewed  RAPID STREP SCREEN (NOT AT [facility])  CULTURE, GROUP A STREP [facility])    Imaging Review Dg Chest 2 View  10/18/2015  CLINICAL DATA:  Cough and short of breath for 3 days EXAM: CHEST  2 VIEW COMPARISON:  03/08/2015 FINDINGS: Mild patchy airspace disease in the lingula. Normal heart size. Lungs otherwise clear. No pneumothorax. No pleural effusion. IMPRESSION: Airspace disease in the lingula. Early pneumonia is not excluded. Followup PA and lateral chest X-ray is recommended in 3-4  weeks following trial of antibiotic therapy to ensure resolution and exclude underlying malignancy. Electronically Signed   By: Marybelle Killings M.D.   On: 10/18/2015 08:36   I have personally reviewed and evaluated these images and lab results as part of my medical decision-making.   EKG Interpretation None      MDM   Final diagnoses:  Community acquired pneumonia  Wheezing    Patient presents to ED with sore throat, cough, and shortness of breath. Patient is labored in her breathing and diffuse wheezing and adventitious breath sounds heard on auscultation. WEll's score 0. Rapid strep negative. Chest x-ray suggestive of early pneumonia. Will treat with ABX course. Patient endorsed improvement in breathing following nebulizer treatment. O2 sats after treatment 100%.  Provided inhaler for relief of wheezing and shortness of breath. Encouraged follow up with PCP in 3-4 weeks for follow up chest x-ray to ensure resolution. Also encouraged use of mask while working with powder paint. Discussed importance of tobacco cessation. Patient voiced understanding and is agreeable.     Roxanna Mew, [place] 10/18/15 1010  Dorie Rank, MD 10/19/15 1019

## 2015-10-18 NOTE — ED Notes (Signed)
Patient states started with sore throat on Saturday, laryngitis by Sunday.  Patient states ribs hurt when she coughs, back hurts with inspiration.   Patient states she has been running fevers for the last few days, none at triage.   Sometimes dry, sometimes productive cough with yellow sputum.

## 2015-10-18 NOTE — Discharge Instructions (Signed)
You chest x-ray is suggestive of pneumonia. You are being prescribed an antibiotic. Take the full course even if feeling better. Use the inhaler 2 puffs every 6 hours as needed for shortness of breath/wheezing. It is imperative that you follow up with your PCP in the next 3 days for ED follow up. It is also important for a repeat chest x-ray in 3-4 weeks for re-evaluation of resolution. If your symptoms worsen, you develop a fever, chest pain, leg pain, or leg swelling follow up in the ED immediately. 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   Community-Acquired Pneumonia, Adult Pneumonia is an infection of the lungs. One type of pneumonia can happen while a person is in a hospital. A different type can happen when a person is not in a hospital (community-acquired pneumonia). It is easy for this kind to spread from person to person. It can spread to you if you breathe near an infected person who coughs or sneezes. Some symptoms include:  A dry cough.  A wet (productive) cough.  Fever.  Sweating.  Chest pain. HOME CARE  Take over-the-counter and prescription medicines only as told by your doctor.  Only take cough medicine if you are losing sleep.  If you were prescribed an antibiotic medicine, take it as told by your doctor. Do not stop taking the antibiotic even if you start to feel better.  Sleep with your head and neck raised (elevated). You can do this by putting a few pillows under your head, or you can sleep in a recliner.  Do not use tobacco products. These include cigarettes, chewing tobacco, and e-cigarettes. If you need help quitting, ask your doctor.  Drink enough water to keep your pee (urine) clear or pale yellow. A shot (vaccine) can help prevent pneumonia. Shots are often suggested for:  People older than 40 years of age.  People older than 40 years of age:  Who are having cancer treatment.  Who have long-term  (chronic) lung disease.  Who have problems with their body's defense system (immune system). You may also prevent pneumonia if you take these actions:  Get the flu (influenza) shot every year.  Go to the dentist as often as told.  Wash your hands often. If soap and water are not available, use hand sanitizer. GET HELP IF:  You have a fever.  You lose sleep because your cough medicine does not help. GET HELP RIGHT AWAY IF:  You are short of breath and it gets worse.  You have more chest pain.  Your sickness gets worse. This is very serious if:  You are an older adult.  Your body's defense system is weak.  You cough up blood.   This information is not intended to replace advice given to you by your health care provider. Make sure you discuss any questions you have with your health care provider.   Document Released: 11/19/2007 Document Revised: 02/21/2015 Document Reviewed: 09/27/2014 Elsevier Interactive Patient Education [employer].

## 2015-10-20 LAB — CULTURE, GROUP A STREP (THRC)

## 2015-12-31 ENCOUNTER — Emergency Department (HOSPITAL_COMMUNITY)
Admission: EM | Admit: 2015-12-31 | Discharge: 2015-12-31 | Disposition: A | Discharge status: Home / Self Care | Payer: Self-pay | Attending: Emergency Medicine | Admitting: Emergency Medicine

## 2015-12-31 ENCOUNTER — Encounter (HOSPITAL_COMMUNITY): Payer: Self-pay

## 2015-12-31 DIAGNOSIS — Z720 Tobacco use: Secondary | ICD-10-CM

## 2015-12-31 DIAGNOSIS — F17210 Nicotine dependence, cigarettes, uncomplicated: Secondary | ICD-10-CM | POA: Insufficient documentation

## 2015-12-31 DIAGNOSIS — K029 Dental caries, unspecified: Secondary | ICD-10-CM

## 2015-12-31 DIAGNOSIS — K047 Periapical abscess without sinus: Secondary | ICD-10-CM | POA: Insufficient documentation

## 2015-12-31 MED ORDER — PENICILLIN V POTASSIUM 500 MG PO TABS: 1000.0000 mg | ORAL_TABLET | Freq: Two times a day (BID) | ORAL | Status: DC

## 2015-12-31 NOTE — ED Notes (Signed)
Pt here with c/o dental pain to bottom left back tooth and states she is concerned about it turning into an abscess.

## 2015-12-31 NOTE — Discharge Instructions (Signed)
Apply warm compresses to jaw throughout the day. Take antibiotic until finished. Use tylenol or motrin as needed for pain. Perform salt water swishes to help with pain/swelling. Use orajel to the area to help with pain. Followup with a dentist is very important for ongoing evaluation and management of recurrent dental pain, call the dentist listed above in the next 24-48 hours to schedule ongoing dental care, or use the list below to find a dentist. STOP SMOKING! Return to emergency department for emergent changing or worsening symptoms.   Dental Abscess A dental abscess is pus in or around a tooth. HOME CARE  Take medicines only as told by your dentist.  If you were prescribed antibiotic medicine, finish all of it even if you start to feel better.  Rinse your mouth (gargle) often with salt water.  Do not drive or use heavy machinery, like a lawn mower, while taking pain medicine.  Do not apply heat to the outside of your mouth.  Keep all follow-up visits as told by your dentist. This is important. GET HELP IF:  Your pain is worse, and medicine does not help. GET HELP RIGHT AWAY IF:  You have a fever or chills.  Your symptoms suddenly get worse.  You have a very bad headache.  You have problems breathing or swallowing.  You have trouble opening your mouth.  You have puffiness (swelling) in your neck or around your eye.   This information is not intended to replace advice given to you by your health care provider. Make sure you discuss any questions you have with your health care provider.   Document Released: 10/17/2014 Document Reviewed: 10/17/2014 Elsevier Interactive Patient Education 2016 [employer] Dental caries is tooth decay. This decay can cause a hole in teeth (cavity) that can get bigger and deeper over time. HOME CARE  Brush and floss your teeth. Do this at least two times a day.  Use a fluoride toothpaste.  Use a mouth rinse if told by  your dentist or doctor.  Eat less sugary and starchy foods. Drink less sugary drinks.  Avoid snacking often on sugary and starchy foods. Avoid sipping often on sugary drinks.  Keep regular checkups and cleanings with your dentist.  Use fluoride supplements if told by your dentist or doctor.  Allow fluoride to be applied to teeth if told by your dentist or doctor.   This information is not intended to replace advice given to you by your health care provider. Make sure you discuss any questions you have with your health care provider.   Document Released: 03/11/2008 Document Revised: 06/23/2014 Document Reviewed: 06/04/2012 Elsevier Interactive Patient Education 2016 [place] Pain Dental pain may be caused by many things, including:  Tooth decay (cavities or caries). Cavities cause the nerve of your tooth to be open to air and hot or cold temperatures. This can cause pain or discomfort.  Abscess or infection. A dental abscess is an area that is full of infected pus from a bacterial infection in the inner part of the tooth (pulp). It usually happens at the end of the tooth's root.  Injury.  An unknown reason (idiopathic). Your pain may be mild or severe. It may only happen when:  You are chewing.  You are exposed to hot or cold temperature.  You are eating or drinking sugary foods or beverages, such as:  Soda.  Candy. Your pain may also be there all of the time. HOME CARE Watch  your dental pain for any changes. Do these things to lessen your discomfort:  Take medicines only as told by your dentist.  If your dentist tells you to take an antibiotic medicine, finish all of it even if you start to feel better.  Keep all follow-up visits as told by your dentist. This is important.  Do not apply heat to the outside of your face.  Rinse your mouth or gargle with salt water if told by your dentist. This helps with pain and swelling.  You can make salt water by  adding  tsp of salt to 1 cup of warm water.  Apply ice to the painful area of your face:  Put ice in a plastic bag.  Place a towel between your skin and the bag.  Leave the ice on for 20 minutes, 2-3 times per day.  Avoid foods or drinks that cause you pain, such as:  Very hot or very cold foods or drinks.  Sweet or sugary foods or drinks. GET HELP IF:  Your pain is not helped with medicines.  Your symptoms are worse.  You have new symptoms. GET HELP RIGHT AWAY IF:  You cannot open your mouth.  You are having trouble breathing or swallowing.  You have a fever.  Your face, neck, or jaw is puffy (swollen).   This information is not intended to replace advice given to you by your health care provider. Make sure you discuss any questions you have with your health care provider.   Document Released: 11/19/2007 Document Revised: 10/17/2014 Document Reviewed: 05/29/2014 Elsevier Interactive Patient Education 2016 [employer].  Smoking Cessation, Tips for Success If you are ready to quit smoking, congratulations! You have chosen to help yourself be healthier. Cigarettes bring nicotine, tar, carbon monoxide, and other irritants into your body. Your lungs, heart, and blood vessels will be able to work better without these poisons. There are many different ways to quit smoking. Nicotine gum, nicotine patches, a nicotine inhaler, or nicotine nasal spray can help with physical craving. Hypnosis, support groups, and medicines help break the habit of smoking. WHAT THINGS CAN I DO TO MAKE QUITTING EASIER?  Here are some tips to help you quit for good:  Pick a date when you will quit smoking completely. Tell all of your friends and family about your plan to quit on that date.  Do not try to slowly cut down on the number of cigarettes you are smoking. Pick a quit date and quit smoking completely starting on that day.  Throw away all cigarettes.   Clean and remove all ashtrays from  your home, work, and car.  On a card, write down your reasons for quitting. Carry the card with you and read it when you get the urge to smoke.  Cleanse your body of nicotine. Drink enough water and fluids to keep your urine clear or pale yellow. Do this after quitting to flush the nicotine from your body.  Learn to predict your moods. Do not let a bad situation be your excuse to have a cigarette. Some situations in your life might tempt you into wanting a cigarette.  Never have "just one" cigarette. It leads to wanting another and another. Remind yourself of your decision to quit.  Change habits associated with smoking. If you smoked while driving or when feeling stressed, try other activities to replace smoking. Stand up when drinking your coffee. Brush your teeth after eating. Sit in a different chair when you read the paper.  Avoid alcohol while trying to quit, and try to drink fewer caffeinated beverages. Alcohol and caffeine may urge you to smoke.  Avoid foods and drinks that can trigger a desire to smoke, such as sugary or spicy foods and alcohol.  Ask people who smoke not to smoke around you.  Have something planned to do right after eating or having a cup of coffee. For example, plan to take a walk or exercise.  Try a relaxation exercise to calm you down and decrease your stress. Remember, you may be tense and nervous for the first 2 weeks after you quit, but this will pass.  Find new activities to keep your hands busy. Play with a pen, coin, or rubber band. Doodle or draw things on paper.  Brush your teeth right after eating. This will help cut down on the craving for the taste of tobacco after meals. You can also try mouthwash.   Use oral substitutes in place of cigarettes. Try using lemon drops, carrots, cinnamon sticks, or chewing gum. Keep them handy so they are available when you have the urge to smoke.  When you have the urge to smoke, try deep breathing.  Designate your  home as a nonsmoking area.  If you are a heavy smoker, ask your health care provider about a prescription for nicotine chewing gum. It can ease your withdrawal from nicotine.  Reward yourself. Set aside the cigarette money you save and buy yourself something nice.  Look for support from others. Join a support group or smoking cessation program. Ask someone at home or at work to help you with your plan to quit smoking.  Always ask yourself, "Do I need this cigarette or is this just a reflex?" Tell yourself, "Today, I choose not to smoke," or "I do not want to smoke." You are reminding yourself of your decision to quit.  Do not replace cigarette smoking with electronic cigarettes (commonly called e-cigarettes). The safety of e-cigarettes is unknown, and some may contain harmful chemicals.  If you relapse, do not give up! Plan ahead and think about what you will do the next time you get the urge to smoke. HOW WILL I FEEL WHEN I QUIT SMOKING? You may have symptoms of withdrawal because your body is used to nicotine (the addictive substance in cigarettes). You may crave cigarettes, be irritable, feel very hungry, cough often, get headaches, or have difficulty concentrating. The withdrawal symptoms are only temporary. They are strongest when you first quit but will go away within 10-14 days. When withdrawal symptoms occur, stay in control. Think about your reasons for quitting. Remind yourself that these are signs that your body is healing and getting used to being without cigarettes. Remember that withdrawal symptoms are easier to treat than the major diseases that smoking can cause.  Even after the withdrawal is over, expect periodic urges to smoke. However, these cravings are generally short lived and will go away whether you smoke or not. Do not smoke! WHAT RESOURCES ARE AVAILABLE TO HELP ME QUIT SMOKING? Your health care provider can direct you to community resources or hospitals for support, which  may include:  Group support.  Education.  Hypnosis.  Therapy.   This information is not intended to replace advice given to you by your health care provider. Make sure you discuss any questions you have with your health care provider.   Document Released: 02/29/2004 Document Revised: 06/23/2014 Document Reviewed: 11/18/2012 Elsevier Interactive Patient Education 2016 [employer].   Emergency Department  Resource Guide 1) Find a Tax adviser and Pay Out of Pocket Although you won't have to find out who is covered by your insurance plan, it is a good idea to ask around and get recommendations. You will then need to call the office and see if the doctor you have chosen will accept you as a new patient and what types of options they offer for patients who are self-pay. Some doctors offer discounts or will set up payment plans for their patients who do not have insurance, but you will need to ask so you aren't surprised when you get to your appointment.  2) Contact Your Local Health Department Not all health departments have doctors that can see patients for sick visits, but many do, so it is worth a call to see if yours does. If you don't know where your local health department is, you can check in your phone book. The CDC also has a tool to help you locate your state's health department, and many state websites also have listings of all of their local health departments.  3) Find a [facility] If your illness is not likely to be very severe or complicated, you may want to try a walk in clinic. These are popping up all over the country in pharmacies, drugstores, and shopping centers. They're usually staffed by nurse practitioners or physician assistants that have been trained to treat common illnesses and complaints. They're usually fairly quick and inexpensive. However, if you have serious medical issues or chronic medical problems, these are probably not your best option.  No Primary Care  Doctor: - Call [facility] at  [phone] - they can help you locate a primary care doctor that  accepts your insurance, provides certain services, etc. - Physician Referral Service- [phone]  Chronic Pain Problems: Organization         Address  Phone   Notes  [facility]  [phone] Patients need to be referred by their primary care doctor.   Medication Assistance: Organization         Address  Phone   Notes  [facility] Medication [facility] [address] [place], [place] [address] [phone] --Must be a resident of [facility] -- Must have NO insurance coverage whatsoever (no Medicaid/ Medicare, etc.) -- The pt. MUST have a primary care doctor that directs their care regularly and follows them in the community   [facility]  [phone]   [place]  [phone]     Dental Care: Organization         Address  Phone  Notes  [facility] Department of [facility] [phone] Accepts children up to age 50 who are enrolled in [place] or [place]; pregnant women with a Medicaid card; and children who have applied for Medicaid or [place] Health Choice, but were declined, whose parents can pay a reduced fee at time of service.  [facility] Department of [facility]  [address], [place] [phone] Accepts children up to age 20 who are enrolled in [place] or [place]; pregnant women with a Medicaid card; and children who have applied for Medicaid or [place] Health Choice, but were declined, whose parents can pay a reduced fee at time of service.  [place] Adult [facility] PRO[facility]  [place] [phone] Patients are seen by appointment only. Walk-ins are not  accepted. [place] will see patients 60 years of age and older. Monday - Tuesday (8am-5pm) Most Wednesdays (8:30-5pm) $30 per visit,  cash only  [facility] Adult Dental Access PRO[facility]  [address], [facility] [phone] Patients are seen by appointment only. Walk-ins are not accepted. [place] will see patients 88 years of age and older. One Wednesday Evening (Monthly: Volunteer Based).  $30 per visit, cash only  [place]  [phone] for adults; Children under age 65, call Graduate Pediatric Dentistry at [phone]. Children aged 11-14, please call [phone] to request a pediatric application.  Dental services are provided in all areas of dental care including fillings, crowns and bridges, complete and partial dentures, implants, gum treatment, root canals, and extractions. Preventive care is also provided. Treatment is provided to both adults and children. Patients are selected via a lottery and there is often a waiting list.   [facility] [address], [place]  [phone] [email or web address]   [facility] [address] [place], [place] [phone], Ext. [phone] Second and Fourth Thursday of each month, opens at 6:30 AM; Clinic ends at 9 AM.  Patients are seen on a first-come first-served basis, and a limited number are seen during each clinic.   [facility]  [address] [place], [place] [phone]   Eligibility Requirements You must have lived in [place], [place], or [place] counties for at least the last three months.   You cannot be eligible for state or federal sponsored [employer], including [employer], [place], or [employer].   You generally cannot be eligible for healthcare insurance through your employer.    How to apply: Eligibility screenings are held every Tuesday and Wednesday afternoon from 1:00 pm until 4:00 pm. You do not need an appointment for the interview!  [facility] [address], [place], [place]   [place]   [place]  [place]  [phone]

## 2015-12-31 NOTE — ED Provider Notes (Signed)
CSN: [id number]     Arrival date & time 12/31/15  1721 History  By signing my name below, I, Eustaquio Maize, attest that this documentation has been prepared under the direction and in the presence of [address] [employer]. Electronically Signed: Eustaquio Maize, ED Scribe. 12/31/2015. 6:58 PM.   Chief Complaint  Patient presents with  . Dental Pain   Patient is a 40 y.o. female presenting with tooth pain. The history is provided by the patient. No language interpreter was used.  Dental Pain Location:  Lower Lower teeth location:  18/LL 2nd molar and 19/LL 1st molar Quality:  Throbbing Severity:  Severe Onset quality:  Gradual Duration:  2 days Timing:  Constant Progression:  Unchanged Chronicity:  New Context: abscess   Relieved by: applying pressure to left side of face. Worsened by:  Nothing tried Ineffective treatments:  Acetaminophen and NSAIDs Associated symptoms: gum swelling   Associated symptoms: no difficulty swallowing, no drooling, no facial swelling, no fever, no neck pain, no neck swelling, no oral bleeding and no trismus   Risk factors: smoking      HPI Comments: Tijuana Hartin is a 40 y.o. female who presents to the Emergency Department complaining of gradual onset, constant, 8/10, throbbing, left lower dental pain radiating to left ear x 2 days. There are no exacerbating factors to the pain. The pain is mildly alleviated with applying pressure to the left side of her face. She has taken Aleve and Advil without relief. She also complains of swelling to the surrounding gums with a greenish drainage that she expelled from the swollen area earlier when she "popped it". Pt does currently have a dentist but has not called to make an appointment yet. She presents to the ED today in case she needs antibiotics prior to seeing dentists. Denies facial swelling, neck swelling, fever, chills, ear drainage, trismus, drooling, chest pain, shortness of breath, abdominal pain, nausea,  vomiting, diarrhea, constipation, dysuria, hematuria, weakness, numbness, tingling, or any other associated symptoms. She is a current everyday smoker. NKDA.   Past Medical History  Diagnosis Date  . Ovarian cyst   . Fibroid    Past Surgical History  Procedure Laterality Date  . Btl    . Cryo therapy      done at age 40.  Dx with cervical cancer   Family History  Problem Relation Age of Onset  . Hypertension Maternal Grandmother   . Heart disease Maternal Grandmother   . Cancer Maternal Grandmother   . Hypertension Maternal Grandfather   . Diabetes Maternal Grandfather   . Cancer Maternal Grandfather   . Hypertension Paternal Grandmother   . Hypertension Paternal Grandfather    Social History  Substance Use Topics  . Smoking status: Current Every Day Smoker -- 1.00 packs/day    Types: Cigarettes  . Smokeless tobacco: Never Used  . Alcohol Use: Yes     Comment: occasionally   OB History    Gravida Para Term Preterm AB TAB SAB Ectopic Multiple Living   4 3 3  1  1   3      Review of Systems  Constitutional: Negative for fever and chills.  HENT: Positive for dental problem and ear pain. Negative for drooling, ear discharge, facial swelling and trouble swallowing.   Respiratory: Negative for shortness of breath.   Cardiovascular: Negative for chest pain.  Gastrointestinal: Negative for nausea, vomiting, abdominal pain, diarrhea and constipation.  Genitourinary: Negative for dysuria and hematuria.  Musculoskeletal: Negative for myalgias, arthralgias and  neck pain.  Skin: Negative for color change.  Allergic/Immunologic: Negative for immunocompromised state.  Neurological: Negative for weakness and numbness.  Psychiatric/Behavioral: Negative for confusion.  A complete 10 system review of systems was obtained and all systems are negative except as noted in the HPI and PMH.   Allergies  Review of patient's allergies indicates no known allergies.  Home Medications    Prior to Admission medications   Medication Sig Start Date End Date Taking? Authorizing Provider  Acetaminophen-Codeine (TYLENOL/CODEINE #3) 300-30 MG per tablet Take 1 tablet by mouth every 6 (six) hours as needed for pain. 04/30/13   Amber Fidel Levy, MD  amitriptyline (ELAVIL) 50 MG tablet Take 1 tablet (50 mg total) by mouth at bedtime. 01/27/13   Woodroe Mode, MD  azithromycin (ZITHROMAX Z-PAK) 250 MG tablet On the first day, take two tabs. On days 2-5, take one tab. 10/18/15   Roxanna Mew, PA-C  BIOTIN PO Take 1 tablet by mouth daily with lunch.     Historical Provider, MD  CALCIUM PO Take 1 tablet by mouth daily with lunch.    Historical Provider, MD  fluticasone (FLONASE) 50 MCG/ACT nasal spray Place 2 sprays into both nostrils daily. 04/30/13   Amber Fidel Levy, MD  HYDROcodone-acetaminophen (NORCO/VICODIN) 5-325 MG per tablet Take 1 tablet by mouth every 4 (four) hours as needed for severe pain. 11/13/14   Clayton Bibles, PA-C  ibuprofen (ADVIL,MOTRIN) 800 MG tablet Take 1 tablet (800 mg total) by mouth every 8 (eight) hours as needed for mild pain or moderate pain. 11/13/14   Clayton Bibles, PA-C  levonorgestrel (MIRENA) 20 MCG/24HR IUD 1 each by Intrauterine route once.    Historical Provider, MD  Multiple Vitamin (MULITIVITAMIN WITH MINERALS) TABS Take 1 tablet by mouth daily with lunch.     Historical Provider, MD  naproxen (NAPROSYN) 500 MG tablet Take 1 tablet (500 mg total) by mouth 2 (two) times daily. 03/30/13   Rolland Porter, MD   BP 183/88 mmHg  Pulse 60  Temp(Src) 99.1 F (37.3 C) (Oral)  Resp 16  SpO2 100%   Physical Exam  Constitutional: She is oriented to person, place, and time. Vital signs are normal. She appears well-developed and well-nourished.  Non-toxic appearance. No distress.  Afebrile, nontoxic, NAD  HENT:  Head: Normocephalic and atraumatic.  Right Ear: Hearing, tympanic membrane, external ear and ear canal normal.  Left Ear: Hearing, tympanic membrane,  external ear and ear canal normal.  Nose: Nose normal.  Mouth/Throat: Uvula is midline, oropharynx is clear and moist and mucous membranes are normal. No trismus in the jaw. Normal dentition. Dental abscesses and dental caries present. No uvula swelling.  Ears are clear bilaterally. Nose clear. Oropharynx clear and moist, without uvular swelling or deviation, no trismus or drooling, no tonsillar swelling or erythema, no exudates. Diffuse dental decay, small dental abscess to the area adjacent to molar #18-20, no evidence of Ludwig's.   Eyes: Conjunctivae and EOM are normal. Right eye exhibits no discharge. Left eye exhibits no discharge.  Neck: Normal range of motion. Neck supple.  Cardiovascular: Normal rate.   Pulmonary/Chest: Effort normal. No respiratory distress.  Abdominal: Normal appearance. She exhibits no distension.  Musculoskeletal: Normal range of motion.  Neurological: She is alert and oriented to person, place, and time. She has normal strength. No sensory deficit.  Skin: Skin is warm, dry and intact. No rash noted.  Psychiatric: She has a normal mood and affect. Her behavior is normal.  Nursing  note and vitals reviewed.   ED Course  Procedures (including critical care time)  DIAGNOSTIC STUDIES: Oxygen Saturation is 100% on RA, normal by my interpretation.    COORDINATION OF CARE: 6:52 PM-Discussed treatment plan which includes Rx Penicillin with pt at bedside and pt agreed to plan.   Labs Review Labs Reviewed - No data to display  Imaging Review No results found. I have personally reviewed and evaluated these images and lab results as part of my medical decision-making.   EKG Interpretation None      MDM   Final diagnoses:  Pain due to dental caries  Dental abscess  Tobacco use    40 y.o. female here with Dental pain associated with dental decay and possible dental abscess with patient afebrile, non toxic appearing and swallowing secretions well. I gave  patient referral to dentist and stressed the importance of dental follow up for ultimate management of dental pain.  I have also discussed reasons to return immediately to the ER.  Patient expresses understanding and agrees with plan.  I will also give PCN VK. Discussed tylenol/motrin for pain. Smoking cessation advised.   I personally performed the services described in this documentation, which was scribed in my presence. The recorded information has been reviewed and is accurate.  BP 183/88 mmHg  Pulse 60  Temp(Src) 99.1 F (37.3 C) (Oral)  Resp 16  SpO2 100%  Meds ordered this encounter  Medications  . penicillin v potassium (VEETID) 500 MG tablet    Sig: Take 2 tablets (1,000 mg total) by mouth 2 (two) times daily. X 7 days    Dispense:  28 tablet    Refill:  0    Order Specific Question:  Supervising Provider    Answer:  Noemi Chapel [[id number]]        Dusan Lipford Camprubi-Soms, PA-C 12/31/15 1915  Forde Dandy, MD 01/01/16 0003

## 2017-01-22 ENCOUNTER — Emergency Department (HOSPITAL_COMMUNITY)
Admission: EM | Admit: 2017-01-22 | Discharge: 2017-01-22 | Disposition: A | Discharge status: Home / Self Care | Payer: Self-pay | Attending: Emergency Medicine | Admitting: Emergency Medicine

## 2017-01-22 ENCOUNTER — Encounter (HOSPITAL_COMMUNITY): Payer: Self-pay | Admitting: Emergency Medicine

## 2017-01-22 DIAGNOSIS — Z046 Encounter for general psychiatric examination, requested by authority: Secondary | ICD-10-CM

## 2017-01-22 DIAGNOSIS — Z79899 Other long term (current) drug therapy: Secondary | ICD-10-CM | POA: Insufficient documentation

## 2017-01-22 DIAGNOSIS — F17210 Nicotine dependence, cigarettes, uncomplicated: Secondary | ICD-10-CM | POA: Insufficient documentation

## 2017-01-22 DIAGNOSIS — F1510 Other stimulant abuse, uncomplicated: Secondary | ICD-10-CM | POA: Insufficient documentation

## 2017-01-22 LAB — COMPREHENSIVE METABOLIC PANEL
ALT: 18 U/L (ref 14–54)
AST: 18 U/L (ref 15–41)
Albumin: 4.4 g/dL (ref 3.5–5.0)
Alkaline Phosphatase: 62 U/L (ref 38–126)
Anion gap: 9 (ref 5–15)
BUN: 11 mg/dL (ref 6–20)
CHLORIDE: 105 mmol/L (ref 101–111)
CO2: 23 mmol/L (ref 22–32)
CREATININE: 0.74 mg/dL (ref 0.44–1.00)
Calcium: 9.2 mg/dL (ref 8.9–10.3)
Glucose, Bld: 94 mg/dL (ref 65–99)
Potassium: 3.5 mmol/L (ref 3.5–5.1)
Sodium: 137 mmol/L (ref 135–145)
TOTAL PROTEIN: 7.4 g/dL (ref 6.5–8.1)
Total Bilirubin: 0.3 mg/dL (ref 0.3–1.2)

## 2017-01-22 LAB — CBC
HEMATOCRIT: 37.7 % (ref 36.0–46.0)
Hemoglobin: 13.7 g/dL (ref 12.0–15.0)
MCH: 29.1 pg (ref 26.0–34.0)
MCHC: 36.3 g/dL — ABNORMAL HIGH (ref 30.0–36.0)
MCV: 80.2 fL (ref 78.0–100.0)
Platelets: 196 10*3/uL (ref 150–400)
RBC: 4.7 MIL/uL (ref 3.87–5.11)
RDW: 12.6 % (ref 11.5–15.5)
WBC: 9.3 10*3/uL (ref 4.0–10.5)

## 2017-01-22 LAB — RAPID URINE DRUG SCREEN, HOSP PERFORMED
Amphetamines: POSITIVE — AB
BARBITURATES: NOT DETECTED
Benzodiazepines: NOT DETECTED
COCAINE: NOT DETECTED
OPIATES: NOT DETECTED
Tetrahydrocannabinol: NOT DETECTED

## 2017-01-22 LAB — I-STAT BETA HCG BLOOD, ED (MC, WL, AP ONLY): I-stat hCG, quantitative: <5 m[IU]/mL (ref <5)

## 2017-01-22 LAB — SALICYLATE LEVEL: Salicylate Lvl: <7 mg/dL (ref 2.8–30.0)

## 2017-01-22 LAB — ACETAMINOPHEN LEVEL: Acetaminophen (Tylenol), Serum: <10 ug/mL — ABNORMAL LOW (ref 10–30)

## 2017-01-22 LAB — ETHANOL

## 2017-01-22 NOTE — ED Notes (Signed)
Bed: WTR7 Expected date:  Expected time:  Means of arrival:  Comments: 

## 2017-01-22 NOTE — BH Assessment (Signed)
[facility] Assessment Progress Note  Per EDP Brantley Stage, MD, this pt does not require psychiatric hospitalization at this time.  Pt presents under IVC initiated by her daughter, which Dr Oleta Mouse has rescinded.  Pt is to be discharged from [facility] with recommendation [facility] with [facility].  This has been included in pt's discharge instructions.  Pt's nurse has been notified.  Jalene Mullet, [facility] Triage Specialist [phone]
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
## 2017-01-22 NOTE — Discharge Instructions (Signed)
To help you maintain a sober lifestyle, a substance abuse treatment program may be beneficial to you.  Contact [facility] at your earliest opportunity to ask about enrolling in their program: ° °     [facility] °     [address] °     [place], [place] [address] °     [phone] °     New patients are seen at the walk-in clinic every Tuesday from 9:00 am - 12:00 pm °
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
## 2017-01-25 ENCOUNTER — Emergency Department (HOSPITAL_COMMUNITY)
Admission: EM | Admit: 2017-01-25 | Discharge: 2017-01-26 | Disposition: A | Discharge status: Home / Self Care | Payer: Self-pay | Attending: Emergency Medicine | Admitting: Emergency Medicine

## 2017-01-25 ENCOUNTER — Encounter (HOSPITAL_COMMUNITY): Payer: Self-pay | Admitting: Emergency Medicine

## 2017-01-25 DIAGNOSIS — Y999 Unspecified external cause status: Secondary | ICD-10-CM | POA: Insufficient documentation

## 2017-01-25 DIAGNOSIS — S6010XA Contusion of unspecified finger with damage to nail, initial encounter: Secondary | ICD-10-CM

## 2017-01-25 DIAGNOSIS — Y33XXXA Other specified events, undetermined intent, initial encounter: Secondary | ICD-10-CM | POA: Insufficient documentation

## 2017-01-25 DIAGNOSIS — Y929 Unspecified place or not applicable: Secondary | ICD-10-CM | POA: Insufficient documentation

## 2017-01-25 DIAGNOSIS — Y939 Activity, unspecified: Secondary | ICD-10-CM | POA: Insufficient documentation

## 2017-01-25 DIAGNOSIS — M2540 Effusion, unspecified joint: Secondary | ICD-10-CM

## 2017-01-25 DIAGNOSIS — S60122A Contusion of left index finger with damage to nail, initial encounter: Secondary | ICD-10-CM | POA: Insufficient documentation

## 2017-01-25 DIAGNOSIS — F17210 Nicotine dependence, cigarettes, uncomplicated: Secondary | ICD-10-CM | POA: Insufficient documentation

## 2017-01-25 NOTE — ED Provider Notes (Signed)
[facility] DEPT Provider Note   CSN: [id number] Arrival date & time: 01/25/17  2300  By signing my name below, I, Dora Sims, attest that this documentation has been prepared under the direction and in the presence of Will Jeanmarc Viernes, PA-C. Electronically Signed: Dora Sims, Scribe. 01/25/2017. 11:39 PM.  History   Chief Complaint Chief Complaint  Patient presents with  . Hand Pain   The history is provided by the patient. No language interpreter was used.    HPI Comments: Danielle Nolan is a 41 y.o. female who presents to the Emergency Department for evaluation of persistent left index finger pain beginning yesterday. She reports associated bruising, and swelling. She reports the bruising under her nail has been getting worse.  Patient denies any known injury to the finger, but has been cleaning out her grandmother's house recently and moving boxes.  The pain is worse with palpation to the finger as well as with movement of the digit. There are no alleviating factors noted. She denies numbness/tingling, open wounds, or any other associated symptoms.  Past Medical History:  Diagnosis Date  . Fibroid   . Ovarian cyst     Patient Active Problem List   Diagnosis Date Noted  . Other and unspecified ovarian cyst 01/27/2013    Past Surgical History:  Procedure Laterality Date  . BTL    . Cryo Therapy     done at age 31.  Dx with cervical cancer    OB History    Gravida Para Term Preterm AB Living   4 3 3   1 3    SAB TAB Ectopic Multiple Live Births   1               Home Medications    Prior to Admission medications   Medication Sig Start Date End Date Taking? Authorizing Provider  Acetaminophen-Codeine (TYLENOL/CODEINE #3) 300-30 MG per tablet Take 1 tablet by mouth every 6 (six) hours as needed for pain. Patient not taking: Reported on 01/22/2017 04/30/13   Hairford, Tyler Pita, MD  amitriptyline (ELAVIL) 50 MG tablet Take 1 tablet (50 mg total) by mouth at  bedtime. Patient not taking: Reported on 01/22/2017 01/27/13   Woodroe Mode, MD  azithromycin (ZITHROMAX Z-PAK) 250 MG tablet On the first day, take two tabs. On days 2-5, take one tab. Patient not taking: Reported on 01/22/2017 10/18/15   Gay Filler L, PA-C  fluticasone [facility]) 50 MCG/ACT nasal spray Place 2 sprays into both nostrils daily. Patient not taking: Reported on 01/22/2017 04/30/13   Hairford, Tyler Pita, MD  HYDROcodone-acetaminophen (NORCO/VICODIN) 5-325 MG per tablet Take 1 tablet by mouth every 4 (four) hours as needed for severe pain. Patient not taking: Reported on 01/22/2017 11/13/14   Clayton Bibles, PA-C  naproxen (NAPROSYN) 500 MG tablet Take 1 tablet (500 mg total) by mouth 2 (two) times daily with a meal. 01/26/17   Waynetta Pean, PA-C  penicillin v potassium (VEETID) 500 MG tablet Take 2 tablets (1,000 mg total) by mouth 2 (two) times daily. X 7 days Patient not taking: Reported on 01/22/2017 12/31/15   Street, Holcomb, PA-C    Family History Family History  Problem Relation Age of Onset  . Hypertension Maternal Grandmother   . Heart disease Maternal Grandmother   . Cancer Maternal Grandmother   . Hypertension Maternal Grandfather   . Diabetes Maternal Grandfather   . Cancer Maternal Grandfather   . Hypertension Paternal Grandmother   . Hypertension Paternal Grandfather  Social History Social History  Substance Use Topics  . Smoking status: Current Every Day Smoker    Packs/day: 1.00    Types: Cigarettes  . Smokeless tobacco: Never Used  . Alcohol use Yes     Comment: occasionally     Allergies   Patient has no known allergies.   Review of Systems Review of Systems  Constitutional: Negative for fever.  Musculoskeletal: Positive for arthralgias and joint swelling.  Skin: Positive for color change. Negative for wound.  Neurological: Negative for weakness and numbness.   Physical Exam Updated Vital Signs BP 123/74 (BP Location: Right Arm)   Pulse 84    Temp 98.3 F (36.8 C) (Oral)   Resp (!) 22   Ht 5\' 6"  (1.676 m)   Wt 80.3 kg (177 lb)   SpO2 100%   BMI 28.57 kg/m   Physical Exam  Constitutional: She appears well-developed and well-nourished. No distress.  HENT:  Head: Normocephalic.  Eyes: Conjunctivae are normal. Right eye exhibits no discharge. Left eye exhibits no discharge.  Cardiovascular: Normal rate, regular rhythm and intact distal pulses.   Pulses:      Radial pulses are 2+ on the right side, and 2+ on the left side.  Pulmonary/Chest: Effort normal. No respiratory distress.  Musculoskeletal: Normal range of motion. She exhibits tenderness. She exhibits no deformity.  Tenderness to the distal tip of the left index finger with evidence of subungual hematoma. No deformity.  Lymphadenopathy:    She has no cervical adenopathy.  Neurological: She is alert. No sensory deficit. Coordination normal.  Sensation is intact to her distal fingertips.  Skin: Skin is warm and dry. Capillary refill takes less than 2 seconds. No rash noted. She is not diaphoretic. No erythema. No pallor.  Good capillary refill to distal fingertips.  Psychiatric: She has a normal mood and affect. Her behavior is normal.  Nursing note and vitals reviewed.  ED Treatments / Results  Labs (all labs ordered are listed, but only abnormal results are displayed) Labs Reviewed - No data to display  EKG  EKG Interpretation None       Radiology Dg Hand 2 View Left  Result Date: 01/26/2017 CLINICAL DATA:  Injury to the index finger with pain and bruising at the DIP joint EXAM: LEFT HAND - 2 VIEW COMPARISON:  None. FINDINGS: There is no evidence of fracture or dislocation. There is no evidence of arthropathy or other focal bone abnormality. Soft tissues are unremarkable. IMPRESSION: Negative. Electronically Signed   By: Donavan Foil M.D.   On: 01/26/2017 00:10    Procedures Procedures (including critical care time)    INCISION AND DRAINAGE- Nail  trephination  Performed by: Hanley Hays Consent: Verbal consent obtained. Risks and benefits: risks, benefits and alternatives were discussed Type: abscess  Body area: left index finger nail  Trephination done with eye cautery.   Drainage: bloody   Drainage amount: moderate   Patient tolerance: Patient tolerated the procedure well with no immediate complications.     DIAGNOSTIC STUDIES: Oxygen Saturation is 100% on RA, normal by my interpretation.    COORDINATION OF CARE: 11:38 PM Discussed treatment plan with pt at bedside and pt agreed to plan.  Medications Ordered in ED Medications - No data to display   Initial Impression / Assessment and Plan / ED Course  I have reviewed the triage vital signs and the nursing notes.  Pertinent labs & imaging results that were available during my care of the patient were reviewed  by me and considered in my medical decision making (see chart for details).     Patient presents with left fingernail subungual hematoma. X-ray is negative for fracture. Trephination was performed with eye cautery. Blood discharge was obtained. Improvement of pain after trephination. Patient placed in a finger splint by orthopedic technician for protection. Naproxen for pain control. Follow-up with primary care. Return precautions discussed. I advised the patient to follow-up with their primary care provider this week. I advised the patient to return to the emergency department with new or worsening symptoms or new concerns. The patient verbalized understanding and agreement with plan.      Final Clinical Impressions(s) / ED Diagnoses   Final diagnoses:  Painful swelling of joint  Subungual hematoma of finger of left hand, initial encounter    New Prescriptions New Prescriptions   NAPROXEN (NAPROSYN) 500 MG TABLET    Take 1 tablet (500 mg total) by mouth 2 (two) times daily with a meal.   I personally performed the services described in this  documentation, which was scribed in my presence. The recorded information has been reviewed and is accurate.      Waynetta Pean, PA-C 01/26/17 Dyer, Wenda Overland, MD 01/28/17 [phone]

## 2017-01-25 NOTE — ED Triage Notes (Signed)
Patient with left pointer finger pain.  The finger is swollen, red and bruised.  She states she does not remember injuring the finger.

## 2017-01-26 ENCOUNTER — Emergency Department (HOSPITAL_COMMUNITY): Payer: Self-pay

## 2017-01-26 MED ORDER — NAPROXEN 500 MG PO TABS: 500.0000 mg | ORAL_TABLET | Freq: Two times a day (BID) | ORAL | 0 refills | Status: DC

## 2017-01-26 MED ORDER — ACETAMINOPHEN 325 MG PO TABS
650.0000 mg | ORAL_TABLET | Freq: Once | ORAL | Status: AC
  Administered 2017-01-26: 650 mg via ORAL
  Filled 2017-01-26: qty 2

## 2017-01-26 NOTE — Progress Notes (Signed)
Orthopedic Tech Progress Note Patient Details:  Danielle Nolan [birth year] [id number]  Ortho Devices Type of Ortho Device: Finger splint Ortho Device/Splint Location: lue 4th finger splint Ortho Device/Splint Interventions: Ordered, Application, Adjustment   Karolee Stamps 01/26/2017, 1:07 AM

## 2017-01-26 NOTE — ED Notes (Signed)
Patient transported to X-ray 

## 2017-01-26 NOTE — ED Notes (Signed)
Ortho tech notified of pt's need for finger splint.

## 2017-01-27 ENCOUNTER — Emergency Department (HOSPITAL_COMMUNITY)
Admission: EM | Admit: 2017-01-27 | Discharge: 2017-01-27 | Disposition: A | Discharge status: Home / Self Care | Payer: Self-pay | Attending: Emergency Medicine | Admitting: Emergency Medicine

## 2017-01-27 ENCOUNTER — Encounter (HOSPITAL_COMMUNITY): Payer: Self-pay | Admitting: Emergency Medicine

## 2017-01-27 DIAGNOSIS — F17210 Nicotine dependence, cigarettes, uncomplicated: Secondary | ICD-10-CM | POA: Insufficient documentation

## 2017-01-27 DIAGNOSIS — L03012 Cellulitis of left finger: Secondary | ICD-10-CM | POA: Insufficient documentation

## 2017-01-27 MED ORDER — DOXYCYCLINE HYCLATE 100 MG PO CAPS: 100.0000 mg | ORAL_CAPSULE | Freq: Two times a day (BID) | ORAL | 0 refills | Status: DC

## 2017-01-27 MED ORDER — BUPIVACAINE HCL 0.25 % IJ SOLN
5.0000 mL | Freq: Once | INTRAMUSCULAR | Status: DC
  Filled 2017-01-27: qty 5

## 2017-01-27 MED ORDER — DOXYCYCLINE HYCLATE 100 MG PO TABS
100.0000 mg | ORAL_TABLET | Freq: Once | ORAL | Status: AC
  Administered 2017-01-27: 100 mg via ORAL
  Filled 2017-01-27: qty 1

## 2017-01-27 MED ORDER — BUPIVACAINE HCL (PF) 0.25 % IJ SOLN
5.0000 mL | Freq: Once | INTRAMUSCULAR | Status: AC
  Administered 2017-01-27: 5 mL
  Filled 2017-01-27: qty 10

## 2017-01-27 NOTE — ED Triage Notes (Signed)
Pt c/o 10/10 pain on her right index finger, pt states she was seen on 8/12 and sent home, now finger is worse and very painful, bruise noticed on the top of the finger, pt here for reevaluation.

## 2017-01-27 NOTE — ED Provider Notes (Signed)
Why DEPT Provider Note   CSN: [id number] Arrival date & time: 01/27/17  4128     History   Chief Complaint Chief Complaint  Patient presents with  . Finger Injury    HPI Danielle Nolan is a 41 y.o. female.  HPI   62yF with L index finger pain/swelling. Seen on 8/12 for subungual hematoma which was trephinated. Exam currently consistent with paronychia with dark blood/pus visible under eponychium. Constant pain/throbbing. Denies new trauma. No fever or chills.   Past Medical History:  Diagnosis Date  . Fibroid   . Ovarian cyst     Patient Active Problem List   Diagnosis Date Noted  . Other and unspecified ovarian cyst 01/27/2013    Past Surgical History:  Procedure Laterality Date  . BTL    . Cryo Therapy     done at age 27.  Dx with cervical cancer    OB History    Gravida Para Term Preterm AB Living   4 3 3   1 3    SAB TAB Ectopic Multiple Live Births   1               Home Medications    Prior to Admission medications   Medication Sig Start Date End Date Taking? Authorizing Provider  Acetaminophen-Codeine (TYLENOL/CODEINE #3) 300-30 MG per tablet Take 1 tablet by mouth every 6 (six) hours as needed for pain. Patient not taking: Reported on 01/22/2017 04/30/13   Hairford, Tyler Pita, MD  amitriptyline (ELAVIL) 50 MG tablet Take 1 tablet (50 mg total) by mouth at bedtime. Patient not taking: Reported on 01/22/2017 01/27/13   Woodroe Mode, MD  azithromycin (ZITHROMAX Z-PAK) 250 MG tablet On the first day, take two tabs. On days 2-5, take one tab. Patient not taking: Reported on 01/22/2017 10/18/15   Gay Filler L, PA-C  fluticasone [facility]) 50 MCG/ACT nasal spray Place 2 sprays into both nostrils daily. Patient not taking: Reported on 01/22/2017 04/30/13   Hairford, Tyler Pita, MD  HYDROcodone-acetaminophen (NORCO/VICODIN) 5-325 MG per tablet Take 1 tablet by mouth every 4 (four) hours as needed for severe pain. Patient not taking: Reported on 01/22/2017  11/13/14   Clayton Bibles, PA-C  naproxen (NAPROSYN) 500 MG tablet Take 1 tablet (500 mg total) by mouth 2 (two) times daily with a meal. 01/26/17   Waynetta Pean, PA-C  penicillin v potassium (VEETID) 500 MG tablet Take 2 tablets (1,000 mg total) by mouth 2 (two) times daily. X 7 days Patient not taking: Reported on 01/22/2017 12/31/15   Street, Hallam, PA-C    Family History Family History  Problem Relation Age of Onset  . Hypertension Maternal Grandmother   . Heart disease Maternal Grandmother   . Cancer Maternal Grandmother   . Hypertension Maternal Grandfather   . Diabetes Maternal Grandfather   . Cancer Maternal Grandfather   . Hypertension Paternal Grandmother   . Hypertension Paternal Grandfather     Social History Social History  Substance Use Topics  . Smoking status: Current Every Day Smoker    Packs/day: 1.00    Types: Cigarettes  . Smokeless tobacco: Never Used  . Alcohol use Yes     Comment: occasionally     Allergies   Patient has no known allergies.   Review of Systems Review of Systems  All systems reviewed and negative, other than as noted in HPI.   Physical Exam Updated Vital Signs BP (!) 139/96 (BP Location: Right Arm)   Pulse 85  Temp 97.9 F (36.6 C) (Oral)   Resp 18   Ht 5\' 6"  (1.676 m)   Wt 80.3 kg (177 lb)   SpO2 100%   BMI 28.57 kg/m   Physical Exam  Constitutional: She appears well-developed and well-nourished. No distress.  HENT:  Head: Normocephalic and atraumatic.  Eyes: Conjunctivae are normal. Right eye exhibits no discharge. Left eye exhibits no discharge.  Neck: Neck supple.  Cardiovascular: Normal rate, regular rhythm and normal heart sounds.  Exam reveals no gallop and no friction rub.   No murmur heard. Pulmonary/Chest: Effort normal and breath sounds normal. No respiratory distress.  Abdominal: Soft. She exhibits no distension. There is no tenderness.  Musculoskeletal:  Paronychia L index finger. Dark red blood/pus  visible in collection. Minimal erythema extending proximally to middle phalanx. Complete subungual hematoma. Nail trephinated. Hole patent. Minimal blood from it with nailbed pressure. Finger tip soft, good cap refill and sensation intact to light touch b/l at tip.   Neurological: She is alert.  Skin: Skin is warm and dry.  Psychiatric: She has a normal mood and affect. Her behavior is normal. Thought content normal.  Nursing note and vitals reviewed.    ED Treatments / Results  Labs (all labs ordered are listed, but only abnormal results are displayed) Labs Reviewed - No data to display  EKG  EKG Interpretation None       Radiology Dg Hand 2 View Left  Result Date: 01/26/2017 CLINICAL DATA:  Injury to the index finger with pain and bruising at the DIP joint EXAM: LEFT HAND - 2 VIEW COMPARISON:  None. FINDINGS: There is no evidence of fracture or dislocation. There is no evidence of arthropathy or other focal bone abnormality. Soft tissues are unremarkable. IMPRESSION: Negative. Electronically Signed   By: Donavan Foil M.D.   On: 01/26/2017 00:10    Procedures Procedures (including critical care time)  INCISION AND DRAINAGE Performed by: Virgel Manifold Consent: Verbal consent obtained. Risks and benefits: risks, benefits and alternatives were discussed Type: abscess  Body area: L index finger paronychia  Anesthesia: local infiltration  Incision was made with a scalpel.  Local anesthetic: 0.25% marcaine w/o (digital block)  Anesthetic total: 2 ml  Complexity: complex Blunt dissection to break up loculations  Drainage: purulent/blood  Drainage amount: moderate  Packing material: none  Patient tolerance: Patient tolerated the procedure well with no immediate complications.     Medications Ordered in ED Medications  bupivacaine (MARCAINE) 0.25 % (with pres) injection 5 mL (not administered)     Initial Impression / Assessment and Plan / ED Course  I have  reviewed the triage vital signs and the nursing notes.  Pertinent labs & imaging results that were available during my care of the patient were reviewed by me and considered in my medical decision making (see chart for details).     41yF with paronychia L index finger. Minimal erythema extending proximally to middle phalanx. Finger tip soft with good cap refill. Will drain. I don't feel strongly needs antibiotics after drainage, but does have some very mild erythema and I just have the general impression that she may not be to most stringent with wound care.   8:06 AM Drained w/o complication. Continued wound care and return precautions discussed.   Final Clinical Impressions(s) / ED Diagnoses   Final diagnoses:  Paronychia of finger, left    New Prescriptions New Prescriptions   No medications on file     Virgel Manifold, MD 01/27/17 [phone]

## 2017-07-09 NOTE — Congregational Nurse Program (Signed)
Congregational Nurse Program Note  Date of Encounter: 07/02/2017  Past Medical History: Past Medical History:  Diagnosis Date  . Fibroid   . Ovarian cyst     Encounter Details: CNP Questionnaire - 07/08/17 1822      Questionnaire   Patient Status  Not Applicable    Race  White or Caucasian    Location Patient Served At  [facility]  Not Applicable    Uninsured  Uninsured (NEW 1x/quarter)    Food  Yes, have food insecurities;Within past 12 months, worried food would run out with no money to buy more    Housing/Utilities  Yes, have permanent housing    Transportation  Yes, need transportation assistance    Interpersonal Safety  No, do not feel physically and emotionally safe where you currently live    Medication  Yes, have medication insecurities    Medical Provider  No    Referrals  Behavioral/Mental Health Provider    ED Visit Averted  Not Applicable    Life-Saving Intervention Made  Not Applicable      Expressed interest in substance abuse treatment.  Does see staff at [facility] for behavioral health treatment.  Referred to [facility] for substance abuse follow up

## 2017-11-13 NOTE — Congregational Nurse Program (Signed)
Congregational Nurse Program Note  Date of Encounter: 10/29/2017  Past Medical History: Past Medical History:  Diagnosis Date  . Fibroid   . Ovarian cyst     Encounter Details: CNP Questionnaire - 10/29/17 1110      Questionnaire   Patient Status  Not Applicable    Race  White or Caucasian    Location Patient Served At  [facility]  Not Applicable    Uninsured  Uninsured (NEW 1x/quarter)    Food  Yes, have food insecurities;Within past 12 months, worried food would run out with no money to buy more    Housing/Utilities  Yes, have permanent housing    Transportation  Yes, need transportation assistance    Interpersonal Safety  No, do not feel physically and emotionally safe where you currently live    Medication  Yes, have medication insecurities    Medical Provider  No    Referrals  Behavioral/Mental Health Provider    ED Visit Averted  Not Applicable    Life-Saving Intervention Made  Not Applicable      Requesting assistance with medications.  Instructed to bring in prescriptions and I can get them filled.  Discussed substance abuse treatment options.  Encouraged [facility] for integrative care

## 2017-12-09 ENCOUNTER — Other Ambulatory Visit: Payer: Self-pay

## 2017-12-10 ENCOUNTER — Encounter (HOSPITAL_COMMUNITY): Payer: Self-pay | Admitting: Emergency Medicine

## 2017-12-10 ENCOUNTER — Emergency Department (HOSPITAL_COMMUNITY): Payer: Self-pay

## 2017-12-10 ENCOUNTER — Observation Stay (HOSPITAL_COMMUNITY)
Admission: EM | Admit: 2017-12-10 | Discharge: 2017-12-12 | Discharge status: Against Medical Advice | Payer: Self-pay | Attending: Surgery | Admitting: Surgery

## 2017-12-10 ENCOUNTER — Other Ambulatory Visit: Payer: Self-pay

## 2017-12-10 DIAGNOSIS — R1011 Right upper quadrant pain: Secondary | ICD-10-CM

## 2017-12-10 DIAGNOSIS — Z8541 Personal history of malignant neoplasm of cervix uteri: Secondary | ICD-10-CM | POA: Insufficient documentation

## 2017-12-10 DIAGNOSIS — F17210 Nicotine dependence, cigarettes, uncomplicated: Secondary | ICD-10-CM | POA: Insufficient documentation

## 2017-12-10 DIAGNOSIS — K82A1 Gangrene of gallbladder in cholecystitis: Secondary | ICD-10-CM | POA: Insufficient documentation

## 2017-12-10 DIAGNOSIS — K8000 Calculus of gallbladder with acute cholecystitis without obstruction: Principal | ICD-10-CM | POA: Insufficient documentation

## 2017-12-10 DIAGNOSIS — K810 Acute cholecystitis: Secondary | ICD-10-CM | POA: Diagnosis present

## 2017-12-10 DIAGNOSIS — K8020 Calculus of gallbladder without cholecystitis without obstruction: Secondary | ICD-10-CM

## 2017-12-10 DIAGNOSIS — K819 Cholecystitis, unspecified: Secondary | ICD-10-CM

## 2017-12-10 LAB — CBC
HCT: 49.1 % — ABNORMAL HIGH (ref 36.0–46.0)
HEMOGLOBIN: 16.3 g/dL — AB (ref 12.0–15.0)
MCH: 28.3 pg (ref 26.0–34.0)
MCHC: 33.2 g/dL (ref 30.0–36.0)
MCV: 85.2 fL (ref 78.0–100.0)
PLATELETS: 231 10*3/uL (ref 150–400)
RBC: 5.76 MIL/uL — ABNORMAL HIGH (ref 3.87–5.11)
RDW: 12.1 % (ref 11.5–15.5)
WBC: 11.9 10*3/uL — ABNORMAL HIGH (ref 4.0–10.5)

## 2017-12-10 LAB — COMPREHENSIVE METABOLIC PANEL
ALBUMIN: 3.6 g/dL (ref 3.5–5.0)
ALK PHOS: 78 U/L (ref 38–126)
ALT: 14 U/L (ref 0–44)
ANION GAP: 12 (ref 5–15)
AST: 17 U/L (ref 15–41)
BILIRUBIN TOTAL: 0.7 mg/dL (ref 0.3–1.2)
CALCIUM: 9.4 mg/dL (ref 8.9–10.3)
CO2: 27 mmol/L (ref 22–32)
CREATININE: 0.8 mg/dL (ref 0.44–1.00)
Chloride: 96 mmol/L — ABNORMAL LOW (ref 98–111)
GFR calc Af Amer: >60 mL/min (ref >60)
GFR calc non Af Amer: >60 mL/min (ref >60)
GLUCOSE: 97 mg/dL (ref 70–99)
Potassium: 3.9 mmol/L (ref 3.5–5.1)
Sodium: 135 mmol/L (ref 135–145)
TOTAL PROTEIN: 7.1 g/dL (ref 6.5–8.1)

## 2017-12-10 LAB — URINALYSIS, ROUTINE W REFLEX MICROSCOPIC
Bilirubin Urine: NEGATIVE
GLUCOSE, UA: NEGATIVE mg/dL
Ketones, ur: NEGATIVE mg/dL
Nitrite: NEGATIVE
PH: 6 (ref 5.0–8.0)
PROTEIN: NEGATIVE mg/dL
SPECIFIC GRAVITY, URINE: 1.006 (ref 1.005–1.030)

## 2017-12-10 LAB — I-STAT BETA HCG BLOOD, ED (MC, WL, AP ONLY): I-stat hCG, quantitative: <5 m[IU]/mL (ref <5)

## 2017-12-10 LAB — LIPASE, BLOOD: Lipase: 28 U/L (ref 11–51)

## 2017-12-10 MED ORDER — PROCHLORPERAZINE MALEATE 10 MG PO TABS
10.0000 mg | ORAL_TABLET | Freq: Four times a day (QID) | ORAL | Status: DC | PRN
  Filled 2017-12-10: qty 1

## 2017-12-10 MED ORDER — ONDANSETRON HCL 4 MG/2ML IJ SOLN
4.0000 mg | Freq: Once | INTRAMUSCULAR | Status: AC
  Administered 2017-12-10: 4 mg via INTRAVENOUS
  Filled 2017-12-10: qty 2

## 2017-12-10 MED ORDER — MUPIROCIN 2 % EX OINT
1.0000 "application " | TOPICAL_OINTMENT | Freq: Two times a day (BID) | CUTANEOUS | Status: DC
  Administered 2017-12-11: 1 via NASAL
  Filled 2017-12-10: qty 22

## 2017-12-10 MED ORDER — HYDROMORPHONE HCL 1 MG/ML IJ SOLN
0.5000 mg | INTRAMUSCULAR | Status: DC | PRN
  Administered 2017-12-10 – 2017-12-11 (×4): 1 mg via INTRAVENOUS
  Filled 2017-12-10 (×4): qty 1

## 2017-12-10 MED ORDER — SODIUM CHLORIDE 0.9 % IV SOLN
2.0000 g | INTRAVENOUS | Status: DC
  Filled 2017-12-10: qty 20

## 2017-12-10 MED ORDER — ONDANSETRON HCL 4 MG/2ML IJ SOLN
4.0000 mg | Freq: Four times a day (QID) | INTRAMUSCULAR | Status: DC | PRN
  Administered 2017-12-10: 4 mg via INTRAVENOUS
  Filled 2017-12-10: qty 2

## 2017-12-10 MED ORDER — HYDROMORPHONE HCL 1 MG/ML IJ SOLN
1.0000 mg | Freq: Once | INTRAMUSCULAR | Status: AC
Start: 2017-12-10 — End: 2017-12-10
  Administered 2017-12-10: 1 mg via INTRAVENOUS
  Filled 2017-12-10: qty 1

## 2017-12-10 MED ORDER — PROCHLORPERAZINE EDISYLATE 10 MG/2ML IJ SOLN: 5.0000 mg | Freq: Four times a day (QID) | INTRAMUSCULAR | Status: DC | PRN

## 2017-12-10 MED ORDER — SODIUM CHLORIDE 0.9 % IV SOLN
2.0000 g | Freq: Once | INTRAVENOUS | Status: AC
  Administered 2017-12-10: 2 g via INTRAVENOUS
  Filled 2017-12-10: qty 20

## 2017-12-10 MED ORDER — KCL IN DEXTROSE-NACL 20-5-0.45 MEQ/L-%-% IV SOLN
INTRAVENOUS | Status: DC
  Administered 2017-12-10 – 2017-12-11 (×2): via INTRAVENOUS
  Filled 2017-12-10 (×2): qty 1000

## 2017-12-10 MED ORDER — ENOXAPARIN SODIUM 40 MG/0.4ML ~~LOC~~ SOLN: 40.0000 mg | SUBCUTANEOUS | Status: DC

## 2017-12-10 MED ORDER — AMITRIPTYLINE HCL 50 MG PO TABS: 50.0000 mg | ORAL_TABLET | Freq: Every day | ORAL | Status: DC

## 2017-12-10 MED ORDER — ONDANSETRON 4 MG PO TBDP: 4.0000 mg | ORAL_TABLET | Freq: Four times a day (QID) | ORAL | Status: DC | PRN

## 2017-12-10 NOTE — ED Notes (Signed)
RN attempted to start IV per Lovilia, PA, unsuccessful.

## 2017-12-10 NOTE — ED Notes (Signed)
ED Provider at bedside. 

## 2017-12-10 NOTE — H&P (Signed)
Danielle Nolan is an 42 y.o. female.   Chief Complaint: Abdominal pain, nausea, vomiting and fevers HPI: Sick since three days ago.  Last bowel movement 3 days ago, diarrhea.  No jaundice.  Chills and fevers.  Pain in the epigatrium and RUQ.  Not associated with eating.  Came on spontaneously.  US shows gallstones and thickened wall.   Past Medical History:  Diagnosis Date  . Fibroid   . Ovarian cyst     Past Surgical History:  Procedure Laterality Date  . BTL    . Cryo Therapy     done at age 37.  Dx with cervical cancer    Family History  Problem Relation Age of Onset  . Hypertension Maternal Grandmother   . Heart disease Maternal Grandmother   . Cancer Maternal Grandmother   . Hypertension Maternal Grandfather   . Diabetes Maternal Grandfather   . Cancer Maternal Grandfather   . Hypertension Paternal Grandmother   . Hypertension Paternal Grandfather    Social History:  reports that she has been smoking cigarettes.  She has been smoking about 1.00 pack per day. She has never used smokeless tobacco. She reports that she drinks alcohol. She reports that she does not use drugs.  Allergies: No Known Allergies   (Not in a hospital admission)  Results for orders placed or performed during the hospital encounter of 12/10/17 (from the past 48 hour(s))  Urinalysis, Routine w reflex microscopic     Status: Abnormal   Collection Time: 12/10/17  6:42 PM  Result Value Ref Range   Color, Urine YELLOW YELLOW   APPearance HAZY (A) CLEAR   Specific Gravity, Urine 1.006 1.005 - 1.030   pH 6.0 5.0 - 8.0   Glucose, UA NEGATIVE NEGATIVE mg/dL   Hgb urine dipstick SMALL (A) NEGATIVE   Bilirubin Urine NEGATIVE NEGATIVE   Ketones, ur NEGATIVE NEGATIVE mg/dL   Protein, ur NEGATIVE NEGATIVE mg/dL   Nitrite NEGATIVE NEGATIVE   Leukocytes, UA LARGE (A) NEGATIVE   RBC / HPF 0-5 0 - 5 RBC/hpf   WBC, UA 21-50 0 - 5 WBC/hpf   Bacteria, UA FEW (A) NONE SEEN   Squamous Epithelial / LPF 0-5 0 -  5   Mucus PRESENT     Comment: Performed at [facility], [address], [place], [place] [address]  Lipase, blood     Status: None   Collection Time: 12/10/17  7:12 PM  Result Value Ref Range   Lipase 28 11 - 51 U/L    Comment: Performed at [facility], [place] [address] [place], [place] [address]  Comprehensive metabolic panel     Status: Abnormal   Collection Time: 12/10/17  7:12 PM  Result Value Ref Range   Sodium 135 135 - 145 mmol/L   Potassium 3.9 3.5 - 5.1 mmol/L   Chloride 96 (L) 98 - 111 mmol/L    Comment: Please note change in reference range.   CO2 27 22 - 32 mmol/L   Glucose, Bld 97 70 - 99 mg/dL    Comment: Please note change in reference range.   BUN <5 (L) 6 - 20 mg/dL    Comment: Please note change in reference range.   Creatinine, Ser 0.80 0.44 - 1.00 mg/dL   Calcium 9.4 8.9 - 10.3 mg/dL   Total Protein 7.1 6.5 - 8.1 g/dL   Albumin 3.6 3.5 - 5.0 g/dL   AST 17 15 - 41 U/L   ALT 14 0 -  44 U/L    Comment: Please note change in reference range.   Alkaline Phosphatase 78 38 - 126 U/L   Total Bilirubin 0.7 0.3 - 1.2 mg/dL   GFR calc non Af Amer >60 >60 mL/min   GFR calc Af Amer >60 >60 mL/min    Comment: (NOTE) The eGFR has been calculated using the CKD EPI equation. This calculation has not been validated in all clinical situations. eGFR's persistently <60 mL/min signify possible Chronic Kidney Disease.    Anion gap 12 5 - 15    Comment: Performed at [facility] [address] [place], [place] [address]  CBC     Status: Abnormal   Collection Time: 12/10/17  7:12 PM  Result Value Ref Range   WBC 11.9 (H) 4.0 - 10.5 K/uL   RBC 5.76 (H) 3.87 - 5.11 MIL/uL   Hemoglobin 16.3 (H) 12.0 - 15.0 g/dL   HCT 49.1 (H) 36.0 - 46.0 %   MCV 85.2 78.0 - 100.0 fL   MCH 28.3 26.0 - 34.0 pg   MCHC 33.2 30.0 - 36.0 g/dL   RDW 12.1 11.5 - 15.5 %   Platelets 231 150 - 400 K/uL    Comment: Performed at [facility] [address] [place],  [place] [address]  I-Stat beta hCG blood, ED     Status: None   Collection Time: 12/10/17  7:26 PM  Result Value Ref Range   I-stat hCG, quantitative <5.0 <5 mIU/mL   Comment 3            Comment:   GEST. AGE      CONC.  (mIU/mL)   <=1 WEEK        5 - 50     2 WEEKS       50 - 500     3 WEEKS       100 - 10,000     4 WEEKS     1,000 - 30,000        FEMALE AND NON-PREGNANT FEMALE:     LESS THAN 5 mIU/mL    Dg Chest 2 View  Result Date: 12/10/2017 CLINICAL DATA:  Epigastric and RLQ pain x3 days. Hx of ovarian cyst. Smoker. EXAM: CHEST - 2 VIEW COMPARISON:  10/18/2015 FINDINGS: Lungs are mildly hyperinflated. There is perihilar peribronchial thickening. No focal consolidations or pleural effusions are identified. IMPRESSION: Mild bronchitic changes.  No focal acute pulmonary abnormality. Electronically Signed   By: Nolon Nations M.D.   On: 12/10/2017 19:32   US Abdomen Limited Ruq  Result Date: 12/10/2017 CLINICAL DATA:  Right upper quadrant pain 4 days. EXAM: ULTRASOUND ABDOMEN LIMITED RIGHT UPPER QUADRANT COMPARISON:  None. FINDINGS: Gallbladder: Moderate cholelithiasis with largest stone measuring 2.6 cm. Several stones over the gallbladder neck. Positive sonographic Murphy's sign. Gallbladder wall is slightly thickened measuring 3.8 mm. Common bile duct: Diameter: 5.1 mm. Liver: No focal lesion identified. Within normal limits in parenchymal echogenicity. Portal vein is patent on color Doppler imaging with normal direction of blood flow towards the liver. IMPRESSION: Moderate cholelithiasis with mild gallbladder wall thickening and positive sonographic Murphy sign as findings suggest acute cholecystitis. Electronically Signed   By: Marin Olp M.D.   On: 12/10/2017 21:08    Review of Systems  Constitutional: Positive for chills and fever.  HENT: Negative.   Eyes: Negative.   Respiratory: Negative.   Cardiovascular: Negative.   Gastrointestinal: Positive for abdominal pain, diarrhea, nausea  and vomiting.  Genitourinary: Negative.   Musculoskeletal: Negative.   Skin: Negative.   All other systems reviewed and are negative.   Blood pressure 127/81, pulse 93, temperature 97.7 F (36.5 C), temperature source Oral, resp. rate 18, SpO2 97 %. Physical Exam  Nursing note and vitals reviewed. Constitutional: She is oriented to person, place, and time. She appears well-developed and well-nourished.  HENT:  Head: Normocephalic and atraumatic.  Right Ear: External ear normal.  Left Ear: External ear normal.  Eyes: Pupils are equal, round, and reactive to light. Conjunctivae and EOM are normal.  Neck: Normal range of motion.  Cardiovascular: Normal rate, regular rhythm and normal heart sounds.  No murmur heard. Respiratory: Effort normal and breath sounds normal.  GI: Soft. Normal appearance and bowel sounds are normal. She exhibits no distension. There is tenderness in the right upper quadrant and epigastric area. There is rebound, guarding (significant guarding out of proportion to physical findings) and positive Murphy's sign.  Musculoskeletal: Normal range of motion.  Neurological: She is alert and oriented to person, place, and time. She has normal reflexes.  Skin: Skin is warm and dry.  Psychiatric: She has a normal mood and affect. Her behavior is normal. Judgment and thought content normal.     Assessment/Plan Acute cholecystitis and cholelithiasis  Admit for IV antibiotics and IV hydration. Likely surgery tomorrow.   Judeth Horn, MD 12/10/2017, 10:31 PM

## 2017-12-10 NOTE — ED Notes (Signed)
Report attempted x 1

## 2017-12-10 NOTE — ED Notes (Signed)
Patient transported to Ultrasound 

## 2017-12-10 NOTE — ED Triage Notes (Signed)
Pt c/o epigastric pain that radiates to the right side and back. Pain has been constant x 2 days, also c/o nausea/vomiting.

## 2017-12-10 NOTE — ED Provider Notes (Signed)
Patient placed in Quick Look pathway, seen and evaluated   Chief Complaint: abdominal pain  HPI:   Danielle Nolan is a 42 y.o. female who presents to the ED with RUQ abdominal pain, n/v and back pain that started last week and lasted for a while but then got better, patient reports that the pain started again 3 days ago and has gotten worse. Patient has not been able to eat in 3 days pain radiates from RUQ to her back.   ROS: GI: nausea and vomiting, epigastric pain   Physical Exam:  BP (!) 122/97 (BP Location: Right Arm)   Pulse (!) 102   Resp 18   SpO2 100%    Gen: No distress  Neuro: Awake and Alert  Skin: Warm and dry, scaring to [facility]   Abdomen: soft, tender epigastric area and RUQ with palpation  Heart: tachycardia        Initiation of care has begun. The patient has been counseled on the process, plan, and necessity for staying for the completion/evaluation, and the remainder of the medical screening examination    Ashley Murrain, NP 12/10/17 1900    Tanna Furry, MD 12/11/17 [phone]

## 2017-12-10 NOTE — ED Provider Notes (Signed)
[facility] DEPARTMENT Provider Note   CSN: [id number] Arrival date & time: 12/10/17  1808     History   Chief Complaint Chief Complaint  Patient presents with  . Abdominal Pain    HPI Danielle Nolan is a 42 y.o. female.  HPI   42 year old female presents today with complaints of right upper quadrant abdominal pain.  She notes a 3-day history of sharp right upper quadrant abdominal pain.  She notes pain with eating, nausea and vomiting chills and fever.  Unable to tolerate solid foods, notes drinking some liquids earlier today.  She denies any surgical history, denies any chronic health conditions or drug use.  No urinary symptoms.  Past Medical History:  Diagnosis Date  . Fibroid   . Ovarian cyst     Patient Active Problem List   Diagnosis Date Noted  . Other and unspecified ovarian cyst 01/27/2013    Past Surgical History:  Procedure Laterality Date  . BTL    . Cryo Therapy     done at age 43.  Dx with cervical cancer     OB History    Gravida  4   Para  3   Term  3   Preterm      AB  1   Living  3     SAB  1   TAB      Ectopic      Multiple      Live Births               Home Medications    Prior to Admission medications   Medication Sig Start Date End Date Taking? Authorizing Provider  Acetaminophen-Codeine (TYLENOL/CODEINE #3) 300-30 MG per tablet Take 1 tablet by mouth every 6 (six) hours as needed for pain. Patient not taking: Reported on 01/22/2017 04/30/13   Hairford, Tyler Pita, MD  amitriptyline (ELAVIL) 50 MG tablet Take 1 tablet (50 mg total) by mouth at bedtime. Patient not taking: Reported on 01/22/2017 01/27/13   Woodroe Mode, MD  azithromycin (ZITHROMAX Z-PAK) 250 MG tablet On the first day, take two tabs. On days 2-5, take one tab. Patient not taking: Reported on 01/22/2017 10/18/15   Frederica Kuster, PA-C  doxycycline (VIBRAMYCIN) 100 MG capsule Take 1 capsule (100 mg total) by mouth 2 (two) times  daily. 01/27/17   Virgel Manifold, MD  fluticasone (FLONASE) 50 MCG/ACT nasal spray Place 2 sprays into both nostrils daily. Patient not taking: Reported on 01/22/2017 04/30/13   Hairford, Tyler Pita, MD  HYDROcodone-acetaminophen (NORCO/VICODIN) 5-325 MG per tablet Take 1 tablet by mouth every 4 (four) hours as needed for severe pain. Patient not taking: Reported on 01/22/2017 11/13/14   Clayton Bibles, PA-C  naproxen (NAPROSYN) 500 MG tablet Take 1 tablet (500 mg total) by mouth 2 (two) times daily with a meal. 01/26/17   Waynetta Pean, PA-C  penicillin v potassium (VEETID) 500 MG tablet Take 2 tablets (1,000 mg total) by mouth 2 (two) times daily. X 7 days Patient not taking: Reported on 01/22/2017 12/31/15   Street, [place], PA-C    Family History Family History  Problem Relation Age of Onset  . Hypertension Maternal Grandmother   . Heart disease Maternal Grandmother   . Cancer Maternal Grandmother   . Hypertension Maternal Grandfather   . Diabetes Maternal Grandfather   . Cancer Maternal Grandfather   . Hypertension Paternal Grandmother   . Hypertension Paternal Grandfather     Social History Social  History   Tobacco Use  . Smoking status: Current Every Day Smoker    Packs/day: 1.00    Types: Cigarettes  . Smokeless tobacco: Never Used  Substance Use Topics  . Alcohol use: Yes    Comment: occasionally  . Drug use: No     Allergies   Patient has no known allergies.   Review of Systems Review of Systems  All other systems reviewed and are negative.  Physical Exam Updated Vital Signs BP 127/81   Pulse 93   Temp 97.7 F (36.5 C) (Oral)   Resp 18   SpO2 97%   Physical Exam  Constitutional: She is oriented to person, place, and time. She appears well-developed and well-nourished.  HENT:  Head: Normocephalic and atraumatic.  Eyes: Pupils are equal, round, and reactive to light. Conjunctivae are normal. Right eye exhibits no discharge. Left eye exhibits no discharge. No  scleral icterus.  Neck: Normal range of motion. No JVD present. No tracheal deviation present.  Pulmonary/Chest: Effort normal. No stridor.  Neurological: She is alert and oriented to person, place, and time. Coordination normal.  Psychiatric: She has a normal mood and affect. Her behavior is normal. Judgment and thought content normal.  Nursing note and vitals reviewed.   ED Treatments / Results  Labs (all labs ordered are listed, but only abnormal results are displayed) Labs Reviewed  COMPREHENSIVE METABOLIC PANEL - Abnormal; Notable for the following components:      Result Value   Chloride 96 (*)    BUN <5 (*)    All other components within normal limits  CBC - Abnormal; Notable for the following components:   WBC 11.9 (*)    RBC 5.76 (*)    Hemoglobin 16.3 (*)    HCT 49.1 (*)    All other components within normal limits  URINALYSIS, ROUTINE W REFLEX MICROSCOPIC - Abnormal; Notable for the following components:   APPearance HAZY (*)    Hgb urine dipstick SMALL (*)    Leukocytes, UA LARGE (*)    Bacteria, UA FEW (*)    All other components within normal limits  LIPASE, BLOOD  I-STAT BETA HCG BLOOD, ED ([facility], [facility], [facility] ONLY)  I-STAT TROPONIN, ED    EKG None  Radiology Dg Chest 2 View  Result Date: 12/10/2017 CLINICAL DATA:  Epigastric and RLQ pain x3 days. Hx of ovarian cyst. Smoker. EXAM: CHEST - 2 VIEW COMPARISON:  10/18/2015 FINDINGS: Lungs are mildly hyperinflated. There is perihilar peribronchial thickening. No focal consolidations or pleural effusions are identified. IMPRESSION: Mild bronchitic changes.  No focal acute pulmonary abnormality. Electronically Signed   By: Nolon Nations M.D.   On: 12/10/2017 19:32   US Abdomen Limited Ruq  Result Date: 12/10/2017 CLINICAL DATA:  Right upper quadrant pain 4 days. EXAM: ULTRASOUND ABDOMEN LIMITED RIGHT UPPER QUADRANT COMPARISON:  None. FINDINGS: Gallbladder: Moderate cholelithiasis with largest stone measuring 2.6 cm.  Several stones over the gallbladder neck. Positive sonographic Murphy's sign. Gallbladder wall is slightly thickened measuring 3.8 mm. Common bile duct: Diameter: 5.1 mm. Liver: No focal lesion identified. Within normal limits in parenchymal echogenicity. Portal vein is patent on color Doppler imaging with normal direction of blood flow towards the liver. IMPRESSION: Moderate cholelithiasis with mild gallbladder wall thickening and positive sonographic Murphy sign as findings suggest acute cholecystitis. Electronically Signed   By: Marin Olp M.D.   On: 12/10/2017 21:08    Procedures Procedures (including critical care time)  Medications Ordered in ED Medications  ondansetron (  ZOFRAN) injection 4 mg (4 mg Intravenous Given 12/10/17 2027)  HYDROmorphone (DILAUDID) injection 1 mg (1 mg Intravenous Given 12/10/17 2135)  cefTRIAXone (ROCEPHIN) 2 g in sodium chloride 0.9 % 100 mL IVPB (0 g Intravenous Stopped 12/10/17 2212)     Initial Impression / Assessment and Plan / ED Course  I have reviewed the triage vital signs and the nursing notes.  Pertinent labs & imaging results that were available during my care of the patient were reviewed by me and considered in my medical decision making (see chart for details).     Labs: I-STAT beta-hCG, lipase, CMP CBC, urinalysis  Imaging: Ultrasound abdomen right upper quadrant  Consults: General surgery   Therapeutics: Rocephin, morphine, Dilaudid  Discharge Meds:   Assessment/Plan: 42 year old female [place] today with acute cholecystitis.  She has no signs of obstructive pathology at this time.  She is afebrile but does have slight elevation in white count.  General surgery consulted for evaluation and management.    Final Clinical Impressions(s) / ED Diagnoses   Final diagnoses:  RUQ abdominal pain  Cholecystitis    ED Discharge Orders    None       Francee Gentile 12/10/17 2228    Daleen Bo, MD 12/11/17 [phone]

## 2017-12-11 ENCOUNTER — Encounter (HOSPITAL_COMMUNITY): Payer: Self-pay | Admitting: Certified Registered"

## 2017-12-11 ENCOUNTER — Observation Stay (HOSPITAL_COMMUNITY): Payer: Self-pay | Admitting: Anesthesiology

## 2017-12-11 ENCOUNTER — Encounter (HOSPITAL_COMMUNITY)
Admission: EM | Discharge status: Against Medical Advice | Payer: Self-pay | Source: Home / Self Care | Attending: Emergency Medicine

## 2017-12-11 ENCOUNTER — Other Ambulatory Visit: Payer: Self-pay

## 2017-12-11 ENCOUNTER — Observation Stay (HOSPITAL_COMMUNITY): Payer: Self-pay

## 2017-12-11 HISTORY — PX: CHOLECYSTECTOMY: SHX55

## 2017-12-11 LAB — SURGICAL PCR SCREEN
MRSA, PCR: NEGATIVE
Staphylococcus aureus: NEGATIVE

## 2017-12-11 SURGERY — LAPAROSCOPIC CHOLECYSTECTOMY WITH INTRAOPERATIVE CHOLANGIOGRAM
Anesthesia: General | Site: Abdomen

## 2017-12-11 MED ORDER — SODIUM CHLORIDE 0.9 % IV SOLN
INTRAVENOUS | Status: DC | PRN
  Administered 2017-12-11: 4 mL

## 2017-12-11 MED ORDER — HEMOSTATIC AGENTS (NO CHARGE) OPTIME
TOPICAL | Status: DC | PRN
  Administered 2017-12-11: 1 via TOPICAL

## 2017-12-11 MED ORDER — ROCURONIUM BROMIDE 50 MG/5ML IV SOLN
INTRAVENOUS | Status: AC
  Filled 2017-12-11: qty 1

## 2017-12-11 MED ORDER — PROPOFOL 10 MG/ML IV BOLUS
INTRAVENOUS | Status: DC | PRN
  Administered 2017-12-11: 110 mg via INTRAVENOUS

## 2017-12-11 MED ORDER — PROPOFOL 10 MG/ML IV BOLUS
INTRAVENOUS | Status: AC
  Filled 2017-12-11: qty 20

## 2017-12-11 MED ORDER — FENTANYL CITRATE (PF) 100 MCG/2ML IJ SOLN
INTRAMUSCULAR | Status: DC | PRN
  Administered 2017-12-11: 50 ug via INTRAVENOUS
  Administered 2017-12-11: 100 ug via INTRAVENOUS
  Administered 2017-12-11 (×2): 50 ug via INTRAVENOUS

## 2017-12-11 MED ORDER — PHENYLEPHRINE 40 MCG/ML (10ML) SYRINGE FOR IV PUSH (FOR BLOOD PRESSURE SUPPORT)
PREFILLED_SYRINGE | INTRAVENOUS | Status: AC
  Filled 2017-12-11: qty 10

## 2017-12-11 MED ORDER — DEXAMETHASONE SODIUM PHOSPHATE 4 MG/ML IJ SOLN
INTRAMUSCULAR | Status: DC | PRN
  Administered 2017-12-11: 8 mg via INTRAVENOUS

## 2017-12-11 MED ORDER — HYDROMORPHONE HCL 1 MG/ML IJ SOLN
0.2500 mg | INTRAMUSCULAR | Status: DC | PRN
  Administered 2017-12-11 (×3): 0.5 mg via INTRAVENOUS

## 2017-12-11 MED ORDER — ONDANSETRON HCL 4 MG/2ML IJ SOLN
INTRAMUSCULAR | Status: AC
Start: 2017-12-11 — End: ?
  Filled 2017-12-11: qty 2

## 2017-12-11 MED ORDER — BUPIVACAINE-EPINEPHRINE (PF) 0.25% -1:200000 IJ SOLN
INTRAMUSCULAR | Status: AC
  Filled 2017-12-11: qty 30

## 2017-12-11 MED ORDER — LACTATED RINGERS IV SOLN
INTRAVENOUS | Status: DC | PRN
  Administered 2017-12-11 (×2): via INTRAVENOUS

## 2017-12-11 MED ORDER — LACTATED RINGERS IV SOLN
INTRAVENOUS | Status: DC
  Administered 2017-12-11: 09:00:00 via INTRAVENOUS

## 2017-12-11 MED ORDER — ENOXAPARIN SODIUM 40 MG/0.4ML ~~LOC~~ SOLN: 40.0000 mg | SUBCUTANEOUS | Status: DC

## 2017-12-11 MED ORDER — EPHEDRINE SULFATE 50 MG/ML IJ SOLN
INTRAMUSCULAR | Status: AC
  Filled 2017-12-11: qty 1

## 2017-12-11 MED ORDER — HYDROMORPHONE HCL 1 MG/ML IJ SOLN
INTRAMUSCULAR | Status: AC
  Filled 2017-12-11: qty 1

## 2017-12-11 MED ORDER — IOPAMIDOL (ISOVUE-300) INJECTION 61%
INTRAVENOUS | Status: AC
  Filled 2017-12-11: qty 50

## 2017-12-11 MED ORDER — SODIUM CHLORIDE 0.9 % IR SOLN
Status: DC | PRN
  Administered 2017-12-11: 1000 mL

## 2017-12-11 MED ORDER — ENSURE ENLIVE PO LIQD: 237.0000 mL | Freq: Two times a day (BID) | ORAL | Status: DC

## 2017-12-11 MED ORDER — SUGAMMADEX SODIUM 200 MG/2ML IV SOLN
INTRAVENOUS | Status: DC | PRN
  Administered 2017-12-11: 300 mg via INTRAVENOUS

## 2017-12-11 MED ORDER — SUCCINYLCHOLINE CHLORIDE 200 MG/10ML IV SOSY
PREFILLED_SYRINGE | INTRAVENOUS | Status: AC
  Filled 2017-12-11: qty 10

## 2017-12-11 MED ORDER — MIDAZOLAM HCL 2 MG/2ML IJ SOLN
INTRAMUSCULAR | Status: AC
  Filled 2017-12-11: qty 2

## 2017-12-11 MED ORDER — DOCUSATE SODIUM 100 MG PO CAPS
100.0000 mg | ORAL_CAPSULE | Freq: Two times a day (BID) | ORAL | Status: DC
  Administered 2017-12-11 (×2): 100 mg via ORAL
  Filled 2017-12-11 (×2): qty 1

## 2017-12-11 MED ORDER — FENTANYL CITRATE (PF) 250 MCG/5ML IJ SOLN
INTRAMUSCULAR | Status: AC
  Filled 2017-12-11: qty 5

## 2017-12-11 MED ORDER — FENTANYL CITRATE (PF) 100 MCG/2ML IJ SOLN
INTRAMUSCULAR | Status: AC
  Filled 2017-12-11: qty 2

## 2017-12-11 MED ORDER — KCL IN DEXTROSE-NACL 20-5-0.45 MEQ/L-%-% IV SOLN
INTRAVENOUS | Status: DC
Start: 2017-12-11 — End: 2017-12-12
  Administered 2017-12-11: 23:00:00 via INTRAVENOUS
  Filled 2017-12-11: qty 1000

## 2017-12-11 MED ORDER — ROCURONIUM BROMIDE 100 MG/10ML IV SOLN
INTRAVENOUS | Status: DC | PRN
  Administered 2017-12-11: 40 mg via INTRAVENOUS
  Administered 2017-12-11: 25 mg via INTRAVENOUS

## 2017-12-11 MED ORDER — MIDAZOLAM HCL 5 MG/5ML IJ SOLN
INTRAMUSCULAR | Status: DC | PRN
  Administered 2017-12-11: 2 mg via INTRAVENOUS

## 2017-12-11 MED ORDER — SUGAMMADEX SODIUM 200 MG/2ML IV SOLN
INTRAVENOUS | Status: AC
  Filled 2017-12-11: qty 2

## 2017-12-11 MED ORDER — ACETAMINOPHEN 500 MG PO TABS
1000.0000 mg | ORAL_TABLET | Freq: Four times a day (QID) | ORAL | Status: DC
  Administered 2017-12-11 (×2): 1000 mg via ORAL
  Filled 2017-12-11 (×2): qty 2

## 2017-12-11 MED ORDER — HYDROMORPHONE HCL 1 MG/ML IJ SOLN
1.0000 mg | INTRAMUSCULAR | Status: DC | PRN
  Administered 2017-12-11 (×2): 1 mg via INTRAVENOUS
  Filled 2017-12-11 (×2): qty 1

## 2017-12-11 MED ORDER — BUPIVACAINE-EPINEPHRINE 0.25% -1:200000 IJ SOLN
INTRAMUSCULAR | Status: DC | PRN
  Administered 2017-12-11: 8 mL

## 2017-12-11 MED ORDER — LIDOCAINE 2% (20 MG/ML) 5 ML SYRINGE
INTRAMUSCULAR | Status: DC | PRN
  Administered 2017-12-11: 60 mg via INTRAVENOUS

## 2017-12-11 MED ORDER — OXYCODONE HCL 5 MG PO TABS
5.0000 mg | ORAL_TABLET | ORAL | Status: DC | PRN
  Administered 2017-12-11 (×2): 10 mg via ORAL
  Filled 2017-12-11 (×2): qty 2

## 2017-12-11 MED ORDER — LIDOCAINE 2% (20 MG/ML) 5 ML SYRINGE
INTRAMUSCULAR | Status: AC
  Filled 2017-12-11: qty 5

## 2017-12-11 MED ORDER — FENTANYL CITRATE (PF) 100 MCG/2ML IJ SOLN
100.0000 ug | Freq: Once | INTRAMUSCULAR | Status: AC
  Administered 2017-12-11: 100 ug via INTRAVENOUS

## 2017-12-11 MED ORDER — DEXAMETHASONE SODIUM PHOSPHATE 10 MG/ML IJ SOLN
INTRAMUSCULAR | Status: AC
  Filled 2017-12-11: qty 1

## 2017-12-11 MED ORDER — ONDANSETRON HCL 4 MG/2ML IJ SOLN: 4.0000 mg | Freq: Four times a day (QID) | INTRAMUSCULAR | Status: DC | PRN

## 2017-12-11 MED ORDER — SUCCINYLCHOLINE CHLORIDE 200 MG/10ML IV SOSY
PREFILLED_SYRINGE | INTRAVENOUS | Status: DC | PRN
  Administered 2017-12-11: 100 mg via INTRAVENOUS

## 2017-12-11 MED ORDER — 0.9 % SODIUM CHLORIDE (POUR BTL) OPTIME
TOPICAL | Status: DC | PRN
  Administered 2017-12-11: 1000 mL

## 2017-12-11 MED ORDER — ONDANSETRON HCL 4 MG/2ML IJ SOLN
INTRAMUSCULAR | Status: DC | PRN
  Administered 2017-12-11: 4 mg via INTRAVENOUS

## 2017-12-11 MED ORDER — METHOCARBAMOL 500 MG PO TABS
500.0000 mg | ORAL_TABLET | Freq: Three times a day (TID) | ORAL | Status: DC
Start: 2017-12-11 — End: 2017-12-12
  Administered 2017-12-11 (×2): 500 mg via ORAL
  Filled 2017-12-11 (×2): qty 1

## 2017-12-11 SURGICAL SUPPLY — 52 items
APL SKNCLS STERI-STRIP NONHPOA (GAUZE/BANDAGES/DRESSINGS) ×1
APPLIER CLIP ROT 10 11.4 M/L (STAPLE) ×2
APR CLP MED LRG 11.4X10 (STAPLE) ×1
BAG SPEC RTRVL 10 TROC 200 (ENDOMECHANICALS)
BAG SPEC RTRVL LRG 6X4 10 (ENDOMECHANICALS)
BENZOIN TINCTURE PRP APPL 2/3 (GAUZE/BANDAGES/DRESSINGS) ×2 IMPLANT
BLADE CLIPPER SURG (BLADE) IMPLANT
CANISTER SUCT 3000ML PPV (MISCELLANEOUS) ×2 IMPLANT
CHLORAPREP W/TINT 26ML (MISCELLANEOUS) ×2 IMPLANT
CLIP APPLIE ROT 10 11.4 M/L (STAPLE) ×1 IMPLANT
COVER MAYO STAND STRL (DRAPES) ×2 IMPLANT
COVER SURGICAL LIGHT HANDLE (MISCELLANEOUS) ×2 IMPLANT
DRAPE C-ARM 42X72 X-RAY (DRAPES) ×2 IMPLANT
DRSG TEGADERM 2-3/8X2-3/4 SM (GAUZE/BANDAGES/DRESSINGS) ×6 IMPLANT
DRSG TEGADERM 4X4.75 (GAUZE/BANDAGES/DRESSINGS) ×2 IMPLANT
ELECT REM PT RETURN 9FT ADLT (ELECTROSURGICAL) ×2
ELECTRODE REM PT RTRN 9FT ADLT (ELECTROSURGICAL) ×1 IMPLANT
FILTER SMOKE EVAC LAPAROSHD (FILTER) ×2 IMPLANT
GAUZE SPONGE 2X2 8PLY STRL LF (GAUZE/BANDAGES/DRESSINGS) ×1 IMPLANT
GLOVE BIO SURGEON STRL SZ7 (GLOVE) ×2 IMPLANT
GLOVE BIO SURGEON STRL SZ7.5 (GLOVE) ×1 IMPLANT
GLOVE BIOGEL PI IND STRL 7.5 (GLOVE) ×1 IMPLANT
GLOVE BIOGEL PI INDICATOR 7.5 (GLOVE) ×2
GLOVE SURG SS PI 6.5 STRL IVOR (GLOVE) ×1 IMPLANT
GOWN STRL REUS W/ TWL LRG LVL3 (GOWN DISPOSABLE) ×3 IMPLANT
GOWN STRL REUS W/ TWL XL LVL3 (GOWN DISPOSABLE) IMPLANT
GOWN STRL REUS W/TWL LRG LVL3 (GOWN DISPOSABLE) ×6
GOWN STRL REUS W/TWL XL LVL3 (GOWN DISPOSABLE) ×2
HEMOSTAT SNOW SURGICEL 2X4 (HEMOSTASIS) ×1 IMPLANT
KIT BASIN OR (CUSTOM PROCEDURE TRAY) ×2 IMPLANT
KIT TURNOVER KIT B (KITS) ×2 IMPLANT
NS IRRIG 1000ML POUR BTL (IV SOLUTION) ×2 IMPLANT
PAD ARMBOARD 7.5X6 YLW CONV (MISCELLANEOUS) ×2 IMPLANT
POUCH RETRIEVAL ECOSAC 10 (ENDOMECHANICALS) IMPLANT
POUCH RETRIEVAL ECOSAC 10MM (ENDOMECHANICALS)
POUCH SPECIMEN RETRIEVAL 10MM (ENDOMECHANICALS) IMPLANT
SCISSORS LAP 5X35 DISP (ENDOMECHANICALS) ×2 IMPLANT
SET CHOLANGIOGRAPH 5 50 .035 (SET/KITS/TRAYS/PACK) ×2 IMPLANT
SET IRRIG TUBING LAPAROSCOPIC (IRRIGATION / IRRIGATOR) ×2 IMPLANT
SLEEVE ENDOPATH XCEL 5M (ENDOMECHANICALS) ×2 IMPLANT
SPECIMEN JAR SMALL (MISCELLANEOUS) ×2 IMPLANT
SPONGE GAUZE 2X2 STER 10/PKG (GAUZE/BANDAGES/DRESSINGS) ×1
STRIP CLOSURE SKIN 1/2X4 (GAUZE/BANDAGES/DRESSINGS) ×2 IMPLANT
SUT MNCRL AB 4-0 PS2 18 (SUTURE) ×2 IMPLANT
TOWEL OR 17X24 6PK STRL BLUE (TOWEL DISPOSABLE) ×2 IMPLANT
TOWEL OR 17X26 10 PK STRL BLUE (TOWEL DISPOSABLE) ×2 IMPLANT
TRAY LAPAROSCOPIC MC (CUSTOM PROCEDURE TRAY) ×2 IMPLANT
TROCAR XCEL BLUNT TIP 100MML (ENDOMECHANICALS) ×2 IMPLANT
TROCAR XCEL NON-BLD 11X100MML (ENDOMECHANICALS) ×2 IMPLANT
TROCAR XCEL NON-BLD 5MMX100MML (ENDOMECHANICALS) ×2 IMPLANT
TUBING INSUFFLATION (TUBING) ×2 IMPLANT
WATER STERILE IRR 1000ML POUR (IV SOLUTION) ×2 IMPLANT

## 2017-12-11 NOTE — Progress Notes (Signed)
Patient ambulating in the hall way- refuses to wear hospital socks.

## 2017-12-11 NOTE — Anesthesia Procedure Notes (Signed)
Procedure Name: Intubation Date/Time: 12/11/2017 11:10 AM Performed by: Orlie Dakin, CRNA Pre-anesthesia Checklist: Patient identified, Emergency Drugs available, Suction available, Patient being monitored and Timeout performed Patient Re-evaluated:Patient Re-evaluated prior to induction Oxygen Delivery Method: Circle system utilized Preoxygenation: Pre-oxygenation with 100% oxygen Induction Type: IV induction, Rapid sequence and Cricoid Pressure applied Laryngoscope Size: Miller and 3 Grade View: Grade I Tube type: Oral Tube size: 7.0 mm Number of attempts: 1 Airway Equipment and Method: Stylet Placement Confirmation: ETT inserted through vocal cords under direct vision,  positive ETCO2 and breath sounds checked- equal and bilateral Secured at: 23 cm Tube secured with: Tape Dental Injury: Teeth and Oropharynx as per pre-operative assessment  Comments: Noted poor dentition of upper incisors pre-op.  None loose per patient report.  4x4s bite block used.

## 2017-12-11 NOTE — Discharge Instructions (Signed)

## 2017-12-11 NOTE — Progress Notes (Signed)
Initial Nutrition Assessment  DOCUMENTATION CODES:   Not applicable  INTERVENTION:   -Once diet is advanced: Ensure Enlive po BID, each supplement provides 350 kcal and 20 grams of protein  NUTRITION DIAGNOSIS:   Inadequate oral intake related to altered GI function as evidenced by NPO status.  GOAL:   Patient will meet greater than or equal to 90% of their needs  MONITOR:   PO intake, Supplement acceptance, Diet advancement, Labs, Weight trends, Skin, I & O's  REASON FOR ASSESSMENT:   Malnutrition Screening Tool    ASSESSMENT:   42 year old female admitted with acute cholecystitis and cholelithiasis.  Pt admitted with calculus of gallbladder with acute cholecystitis.   Pt down for lap chole with IOC. Pt currently NPO for procedure. Unable to perform nutrition-focused physical exam at this time.   Hx obtained from pt mother and close friend at bedside. Both endorse that pt with very poor oral intake over the past 4-5 days related to difficulty keeping foods and liquids down. Prior to this, pt was consuming one meal per day (chicken or pork chop with salad). Per friend, pt would snacks often throughout the day, mainly Dr. Malachi Bonds and [employer] Cups.   Family members deny pt has any recent wt loss. However, pt mom and friend report that pt has experienced progressive wt loss over the past several years. Per mom, UBW around 170#. Pt mother reveals that pt is a recovering meth addict and has "been in a rough place" up until recently. Mom also expressed concern about pt looking generally "unhealthy" with sunken in facial features. Per friend, pt has recently started to live more healthfully and shown more interest in eating. She was very hungry and eager to eat today. Per wt hx, pt has experienced a 20.3% wt loss over the past 10 months, which while not significant, is concerning given pt hx.   Discussed importance of good meal completion to promote healing. Educated pt family on  potential for diet progression post-op. Pt family amenable to Ensure supplements, as pt consumes a lot of chocolate milk at home.   Labs reviewed.   NUTRITION - FOCUSED PHYSICAL EXAM:    Most Recent Value  Orbital Region  Unable to assess  Upper Arm Region  Unable to assess  Thoracic and Lumbar Region  Unable to assess  Buccal Region  Unable to assess  Temple Region  Unable to assess  Clavicle Bone Region  Unable to assess  Clavicle and Acromion Bone Region  Unable to assess  Scapular Bone Region  Unable to assess  Dorsal Hand  Unable to assess  Patellar Region  Unable to assess  Anterior Thigh Region  Unable to assess  Posterior Calf Region  Unable to assess  Edema (RD Assessment)  Unable to assess  Hair  Unable to assess  Eyes  Unable to assess  Mouth  Unable to assess  Skin  Unable to assess  Nails  Unable to assess     Diet Order:   Diet Order           Diet NPO time specified  Diet effective midnight          EDUCATION NEEDS:   Education needs have been addressed  Skin:  Skin Assessment: Skin Integrity Issues: Skin Integrity Issues:: Incisions Incisions: closed abdomen  Last BM:  12/08/17  Height:   Ht Readings from Last 1 Encounters:  12/11/17 5\' 7"  (1.702 m)    Weight:   Wt Readings from  Last 1 Encounters:  12/11/17 141 lb (64 kg)    Ideal Body Weight:  61.4 kg  BMI:  Body mass index is 22.08 kg/m.  Estimated Nutritional Needs:   Kcal:  1586-8257  Protein:  90-105 grams  Fluid:  >1.7 L    Korin Setzler A. Jimmye Norman, RD, LDN, CDE Pager: [phone] After hours Pager: [phone]

## 2017-12-11 NOTE — Care Management (Signed)
Pt in agreement with CM establishing appt with [facility] for 7/23 at 11:30am - CM also explained that pt will need to go to clinic on 18th to apply for orange card (copay assistance with visits) and medication assistance.   Pt may need MATCH at discharge

## 2017-12-11 NOTE — Op Note (Signed)
Laparoscopic Cholecystectomy with IOC Procedure Note  Indications: This patient presents with symptomatic gallbladder disease and will undergo laparoscopic cholecystectomy.   US showed gallstones and a thickened wall.  Pre-operative Diagnosis: Calculus of gallbladder with acute cholecystitis, without mention of obstruction  Post-operative Diagnosis: Same  Surgeon: Maia Petties   Assistants: Brigid Re, PA-C  Anesthesia: General endotracheal anesthesia  ASA Class: 1  Procedure Details  The patient was seen again in the Holding Room. The risks, benefits, complications, treatment options, and expected outcomes were discussed with the patient. The possibilities of reaction to medication, pulmonary aspiration, perforation of viscus, bleeding, recurrent infection, finding a normal gallbladder, the need for additional procedures, failure to diagnose a condition, the possible need to convert to an open procedure, and creating a complication requiring transfusion or operation were discussed with the patient. The likelihood of improving the patient's symptoms with return to their baseline status is good.  The patient and/or family concurred with the proposed plan, giving informed consent. The site of surgery properly noted. The patient was taken to Operating Room, identified as Marleni Gallardo and the procedure verified as Laparoscopic Cholecystectomy with Intraoperative Cholangiogram. A Time Out was held and the above information confirmed.  Prior to the induction of general anesthesia, antibiotic prophylaxis was administered. General endotracheal anesthesia was then administered and tolerated well. After the induction, the abdomen was prepped with Chloraprep and draped in the sterile fashion. The patient was positioned in the supine position.  Local anesthetic agent was injected into the skin near the umbilicus and an incision made. We dissected down to the abdominal fascia with blunt dissection.   The fascia was incised vertically and we entered the peritoneal cavity bluntly.  A pursestring suture of 0-Vicryl was placed around the fascial opening.  The Hasson cannula was inserted and secured with the stay suture.  Pneumoperitoneum was then created with CO2 and tolerated well without any adverse changes in the patient's vital signs. An 11-mm port was placed in the subxiphoid position.  Two 5-mm ports were placed in the right upper quadrant. All skin incisions were infiltrated with a local anesthetic agent before making the incision and placing the trocars.   We positioned the patient in reverse Trendelenburg, tilted slightly to the patient's left.  The omentum was densely adherent to the gallbladder.  We bluntly dissected this away.  The gallbladder was identified and was very thickened, distended, and erythematous.  We decompressed the gallbladder with the suction aspirator.  The fundus was grasped and retracted cephalad. Adhesions were lysed bluntly and with the electrocautery where indicated, taking care not to injure any adjacent organs or viscus. The infundibulum was grasped and retracted laterally, exposing the peritoneum overlying the triangle of Calot. This was then divided and exposed in a blunt fashion. The cystic duct was clearly identified and bluntly dissected circumferentially. The cystic duct was ligated with a clip distally.   An incision was made in the cystic duct and the [facility] cholangiogram catheter introduced. The catheter was secured using a clip. A cholangiogram was then obtained which showed good visualization of the distal and proximal biliary tree with no sign of filling defects or obstruction.  Contrast flowed easily into the duodenum. The catheter was then removed.   The cystic duct was then ligated with clips and divided. The cystic artery was identified, dissected free, ligated with clips and divided as well.   The gallbladder was dissected from the liver bed in retrograde  fashion with the electrocautery. The gallbladder  was removed and placed in an Ecosac. The liver bed was irrigated and inspected. Hemostasis was achieved with the electrocautery. Copious irrigation was utilized and was repeatedly aspirated until clear.  The gallbladder and Eco sac were then removed through the umbilical port site.  We had to enlarge the fascial opening widely to remove the gallbladder because of the size of the stones and the gallbladder.  The fascia was reapproximated with a running 0 Vicryl.  We again inspected the right upper quadrant for hemostasis.  Pneumoperitoneum was released as we removed the trocars.  4-0 Monocryl was used to close the skin.   Benzoin, steri-strips, and clean dressings were applied. The patient was then extubated and brought to the recovery room in stable condition. Instrument, sponge, and needle counts were correct at closure and at the conclusion of the case.   Findings: Severe acute Cholecystitis with Cholelithiasis  Estimated Blood Loss: less than 100 mL         Drains: none         Specimens: Gallbladder           Complications: None; patient tolerated the procedure well.         Disposition: PACU - hemodynamically stable.         Condition: stable  Imogene Burn. Georgette Dover, MD, [facility]  General/ Trauma Surgery  12/11/2017 12:43 PM

## 2017-12-11 NOTE — Progress Notes (Signed)
Day of Surgery   Subjective/Chief Complaint: Still quite uncomfortable Refused AM labs No nausea   Objective: Vital signs in last 24 hours: Temp:  [97.7 F (36.5 C)-98.5 F (36.9 C)] 98 F (36.7 C) (06/28 0615) Pulse Rate:  [76-102] 79 (06/28 0615) Resp:  [18] 18 (06/28 0615) BP: (119-142)/(75-97) 126/82 (06/28 0615) SpO2:  [95 %-100 %] 100 % (06/28 0615) Weight:  [64 kg (141 lb)] 64 kg (141 lb) (06/27 2320) Last BM Date: 12/08/17  Intake/Output from previous day: 06/27 0701 - 06/28 0700 In: 792.8 [I.V.:792.8] Out: -  Intake/Output this shift: No intake/output data recorded.    Lab Results:  Recent Labs    12/10/17 1912  WBC 11.9*  HGB 16.3*  HCT 49.1*  PLT 231   BMET Recent Labs    12/10/17 1912  NA 135  K 3.9  CL 96*  CO2 27  GLUCOSE 97  BUN <5*  CREATININE 0.80  CALCIUM 9.4   PT/INR No results for input(s): LABPROT, INR in the last 72 hours. ABG No results for input(s): PHART, HCO3 in the last 72 hours.  Invalid input(s): PCO2, PO2  Studies/Results: Dg Chest 2 View  Result Date: 12/10/2017 CLINICAL DATA:  Epigastric and RLQ pain x3 days. Hx of ovarian cyst. Smoker. EXAM: CHEST - 2 VIEW COMPARISON:  10/18/2015 FINDINGS: Lungs are mildly hyperinflated. There is perihilar peribronchial thickening. No focal consolidations or pleural effusions are identified. IMPRESSION: Mild bronchitic changes.  No focal acute pulmonary abnormality. Electronically Signed   By: Nolon Nations M.D.   On: 12/10/2017 19:32   US Abdomen Limited Ruq  Result Date: 12/10/2017 CLINICAL DATA:  Right upper quadrant pain 4 days. EXAM: ULTRASOUND ABDOMEN LIMITED RIGHT UPPER QUADRANT COMPARISON:  None. FINDINGS: Gallbladder: Moderate cholelithiasis with largest stone measuring 2.6 cm. Several stones over the gallbladder neck. Positive sonographic Murphy's sign. Gallbladder wall is slightly thickened measuring 3.8 mm. Common bile duct: Diameter: 5.1 mm. Liver: No focal lesion  identified. Within normal limits in parenchymal echogenicity. Portal vein is patent on color Doppler imaging with normal direction of blood flow towards the liver. IMPRESSION: Moderate cholelithiasis with mild gallbladder wall thickening and positive sonographic Murphy sign as findings suggest acute cholecystitis. Electronically Signed   By: Marin Olp M.D.   On: 12/10/2017 21:08    Anti-infectives: Anti-infectives (From admission, onward)   Start     Dose/Rate Route Frequency Ordered Stop   12/11/17 2100  cefTRIAXone (ROCEPHIN) 2 g in sodium chloride 0.9 % 100 mL IVPB     2 g 200 mL/hr over 30 Minutes Intravenous Every 24 hours 12/10/17 2324     12/10/17 2130  cefTRIAXone (ROCEPHIN) 2 g in sodium chloride 0.9 % 100 mL IVPB     2 g 200 mL/hr over 30 Minutes Intravenous  Once 12/10/17 2121 12/10/17 2212      Assessment/Plan: Acute calculus cholecystitis  Laparoscopic cholecystectomy with intraoperative cholangiogram today.  The surgical procedure has been discussed with the patient.  Potential risks, benefits, alternative treatments, and expected outcomes have been explained.  All of the patient's questions at this time have been answered.  The likelihood of reaching the patient's treatment goal is good.  The patient understand the proposed surgical procedure and wishes to proceed.   LOS: 0 days    Maia Petties 12/11/2017

## 2017-12-11 NOTE — Transfer of Care (Signed)
Immediate Anesthesia Transfer of Care Note  Patient: Danielle Nolan  Procedure(s) Performed: LAPAROSCOPIC CHOLECYSTECTOMY WITH INTRAOPERATIVE CHOLANGIOGRAM (N/A Abdomen)  Patient Location: PACU  Anesthesia Type:General  Level of Consciousness: awake  Airway & Oxygen Therapy: Patient Spontanous Breathing and Patient connected to face mask oxygen  Post-op Assessment: Report given to RN and Post -op Vital signs reviewed and stable  Post vital signs: Reviewed and stable  Last Vitals:  Vitals Value Taken Time  BP 136/80 12/11/2017  1:10 PM  Temp    Pulse 88 12/11/2017  1:11 PM  Resp 19 12/11/2017  1:11 PM  SpO2 100 % 12/11/2017  1:11 PM  Vitals shown include unvalidated device data.  Last Pain:  Vitals:   12/11/17 0746  TempSrc:   PainSc: 9          Complications: No apparent anesthesia complications

## 2017-12-11 NOTE — Anesthesia Postprocedure Evaluation (Signed)
Anesthesia Post Note  Patient: Danielle Nolan  Procedure(s) Performed: LAPAROSCOPIC CHOLECYSTECTOMY WITH INTRAOPERATIVE CHOLANGIOGRAM (N/A Abdomen)     Patient location during evaluation: PACU Anesthesia Type: General Level of consciousness: awake and alert Pain management: pain level controlled Vital Signs Assessment: post-procedure vital signs reviewed and stable Respiratory status: spontaneous breathing, nonlabored ventilation and respiratory function stable Cardiovascular status: blood pressure returned to baseline and stable Postop Assessment: no apparent nausea or vomiting Anesthetic complications: no    Last Vitals:  Vitals:   12/11/17 1310 12/11/17 1325  BP: 136/80 130/73  Pulse: 85 80  Resp: 19 14  Temp: (!) 36.4 C   SpO2: 100% 99%    Last Pain:  Vitals:   12/11/17 1325  TempSrc:   PainSc: 8                  Quetzal Meany,W. EDMOND

## 2017-12-11 NOTE — Progress Notes (Signed)
Patient taken down for surgery- lap chole. Patient requested cell phone be held at nurses station during surgery. Phone placed in plastic bag with patients label on the bag. Given to charge nurse. Report called to Marylin Crosby, RN in short stay.

## 2017-12-11 NOTE — Anesthesia Preprocedure Evaluation (Addendum)
Anesthesia Evaluation  Patient identified by MRN, date of birth, ID band Patient awake    Reviewed: Allergy & Precautions, H&P , NPO status , Patient's Chart, lab work & pertinent test results  Airway Mallampati: II  TM Distance: >3 FB Neck ROM: Full    Dental no notable dental hx. (+) Partial Upper, Dental Advisory Given   Pulmonary Current Smoker,    Pulmonary exam normal breath sounds clear to auscultation       Cardiovascular negative cardio ROS   Rhythm:Regular Rate:Normal     Neuro/Psych negative neurological ROS  negative psych ROS   GI/Hepatic negative GI ROS, Neg liver ROS,   Endo/Other  negative endocrine ROS  Renal/GU negative Renal ROS  negative genitourinary   Musculoskeletal   Abdominal   Peds  Hematology negative hematology ROS (+)   Anesthesia Other Findings   Reproductive/Obstetrics negative OB ROS                            Anesthesia Physical Anesthesia Plan  ASA: II  Anesthesia Plan: General   Post-op Pain Management:    Induction: Intravenous, Rapid sequence and Cricoid pressure planned  PONV Risk Score and Plan: 3 and Ondansetron, Dexamethasone and Midazolam  Airway Management Planned: Oral ETT  Additional Equipment:   Intra-op Plan:   Post-operative Plan: Extubation in OR  Informed Consent: I have reviewed the patients History and Physical, chart, labs and discussed the procedure including the risks, benefits and alternatives for the proposed anesthesia with the patient or authorized representative who has indicated his/her understanding and acceptance.   Dental advisory given  Plan Discussed with: CRNA  Anesthesia Plan Comments:        Anesthesia Quick Evaluation

## 2017-12-11 NOTE — Progress Notes (Signed)
Patient was instructed to remain on the unit while ambulating. Patient walked off the unit "to walk her daughter down." I walked outside and instructed patient she is not to be off the unit. Patient had cigarette lighter in hand. MD notified.

## 2017-12-12 ENCOUNTER — Encounter (HOSPITAL_COMMUNITY): Payer: Self-pay | Admitting: Surgery

## 2017-12-12 NOTE — Progress Notes (Signed)
At 3:45am, pt's IV pump was noted to be beeping, when staff went inside the room, pt was not hooked on the IV, this nurse and other staff went to look for pt and nowhere to be found, pt has history of going out of the unit to smoke, pt is alert and oriented, independent with ADLs, [facility] made aware, text paged on call MD, Dr. Grandville Silos.

## 2017-12-22 NOTE — Discharge Summary (Signed)
[facility] Discharge Summary   Patient ID: Danielle Nolan MRN: [id number] DOB/AGE: Sep 06, 1975 42 y.o.  Admit date: 12/10/2017 Discharge date: 12/22/2017  Admitting Diagnosis: Acute cholecystitis  Discharge Diagnosis Patient Active Problem List   Diagnosis Date Noted  . Acute cholecystitis 12/10/2017  . Other and unspecified ovarian cyst 01/27/2013    Consultants None  Imaging: No results found.  Procedures Dr. Georgette Dover (12/11/17) - Laparoscopic Cholecystectomy with [facility] Course:  Patient is a 42 year old female who presented to [facility] with abdominal pain.  Workup showed acute cholecystitis.  Patient was admitted and underwent procedure listed above.  Tolerated procedure well and was transferred to the floor.  Diet was advanced as tolerated.  On POD#1, the patient was voiding well, tolerating diet, ambulating well, pain well controlled, vital signs stable, incisions c/d/i and felt stable for discharge home.  Patient will follow up in our office in 2 weeks and knows to call with questions or concerns. She will call to confirm appointment date/time.    Physical Exam: General:  Alert, NAD, pleasant, comfortable Abd:  Soft, ND, mild tenderness, incisions C/D/I  Allergies as of 12/12/2017   No Known Allergies     Medication List    You have not been prescribed any medications.      Follow-up Information    [facility], [place] [facility]. Go on 12/29/2017.   Specialty:  General Surgery Why:  Follow up appointment scheduled for 7:15 AM. Please arrive 30 min prior to appointment time. Bring photo ID and insurance information.  Contact information: [address] [place] [address] [phone]        [facility]. Go on 01/04/2018.   Why:  for your primary care appt.  Also, please go to this clinic on 7/18 and apply for orange card and medication assistance Contact information: [place]  [id number] [phone]          Signed: Brigid Re, [facility] 12/22/2017, 7:16 AM Pager: [phone] Consults: [phone] Mon-Fri 7:00 am-4:30 pm Sat-Sun 7:00 am-11:30 am

## 2018-01-05 ENCOUNTER — Ambulatory Visit: Payer: Self-pay | Admitting: Internal Medicine

## 2018-02-20 NOTE — Congregational Nurse Program (Signed)
No health concerns this visit

## 2018-04-19 NOTE — Congregational Nurse Program (Signed)
Client voiced no health concerns this visit

## 2019-05-03 ENCOUNTER — Emergency Department (HOSPITAL_COMMUNITY): Payer: Self-pay

## 2019-05-03 ENCOUNTER — Other Ambulatory Visit: Payer: Self-pay

## 2019-05-03 ENCOUNTER — Emergency Department (HOSPITAL_COMMUNITY)
Admission: EM | Admit: 2019-05-03 | Discharge: 2019-05-03 | Disposition: A | Discharge status: Home / Self Care | Payer: Self-pay | Attending: Emergency Medicine | Admitting: Emergency Medicine

## 2019-05-03 DIAGNOSIS — F17210 Nicotine dependence, cigarettes, uncomplicated: Secondary | ICD-10-CM | POA: Insufficient documentation

## 2019-05-03 DIAGNOSIS — Y999 Unspecified external cause status: Secondary | ICD-10-CM | POA: Insufficient documentation

## 2019-05-03 DIAGNOSIS — Y939 Activity, unspecified: Secondary | ICD-10-CM | POA: Insufficient documentation

## 2019-05-03 DIAGNOSIS — Y280XXA Contact with sharp glass, undetermined intent, initial encounter: Secondary | ICD-10-CM | POA: Insufficient documentation

## 2019-05-03 DIAGNOSIS — S91311A Laceration without foreign body, right foot, initial encounter: Secondary | ICD-10-CM | POA: Insufficient documentation

## 2019-05-03 DIAGNOSIS — Y92009 Unspecified place in unspecified non-institutional (private) residence as the place of occurrence of the external cause: Secondary | ICD-10-CM | POA: Insufficient documentation

## 2019-05-03 DIAGNOSIS — Z23 Encounter for immunization: Secondary | ICD-10-CM | POA: Insufficient documentation

## 2019-05-03 MED ORDER — OXYCODONE-ACETAMINOPHEN 5-325 MG PO TABS
1.0000 | ORAL_TABLET | Freq: Once | ORAL | Status: AC
  Administered 2019-05-03: 1 via ORAL
  Filled 2019-05-03: qty 1

## 2019-05-03 MED ORDER — ONDANSETRON HCL 4 MG PO TABS
4.0000 mg | ORAL_TABLET | Freq: Once | ORAL | Status: AC
  Administered 2019-05-03: 16:00:00 4 mg via ORAL
  Filled 2019-05-03: qty 1

## 2019-05-03 MED ORDER — LIDOCAINE HCL (PF) 1 % IJ SOLN
30.0000 mL | Freq: Once | INTRAMUSCULAR | Status: AC
  Administered 2019-05-03: 17:00:00 30 mL
  Filled 2019-05-03: qty 30

## 2019-05-03 MED ORDER — CIPROFLOXACIN HCL 500 MG PO TABS
500.0000 mg | ORAL_TABLET | Freq: Once | ORAL | Status: AC
  Administered 2019-05-03: 500 mg via ORAL
  Filled 2019-05-03: qty 1

## 2019-05-03 MED ORDER — CIPROFLOXACIN HCL 500 MG PO TABS: 500.0000 mg | ORAL_TABLET | Freq: Two times a day (BID) | ORAL | 0 refills | Status: AC

## 2019-05-03 MED ORDER — TETANUS-DIPHTH-ACELL PERTUSSIS 5-2.5-18.5 LF-MCG/0.5 IM SUSP
0.5000 mL | Freq: Once | INTRAMUSCULAR | Status: AC
  Administered 2019-05-03: 16:00:00 0.5 mL via INTRAMUSCULAR
  Filled 2019-05-03: qty 0.5

## 2019-05-03 NOTE — ED Triage Notes (Signed)
Pt stepped on a piece of glass this morning, thinks there is still glass in her R foot. Endorses throbbing pain, bleeding controlled, Sts tetanus is UTD.

## 2019-05-03 NOTE — Discharge Instructions (Addendum)
1. Medications: Tylenol or ibuprofen for pain, usual home medications -Prescription sent to your pharmacy for ciprofloxacin.  This is an antibiotic used to treat skin infections.  Please take as prescribed.  This can cause upset stomach and diarrhea.  You can try taking it with yogurt or a probiotic to help with the side effects.  2. Treatment: ice for swelling, keep wound clean with warm soap and water and keep bandage dry, do not submerge in water for 24 hours  3. Follow Up: Follow-up with your primary care doctor in 2 days to have your wound rechecked.  Return to the emergency department for increased redness, drainage of pus from the wound   WOUND CARE  Keep area clean and dry for 24 hours. Do not remove bandage, if applied.  After 24 hours, remove bandage and wash wound gently with mild soap and warm water. Reapply a new bandage after cleaning wound, if directed.   Continue daily cleansing with soap and water until stitches/staples are removed.  Do not apply any ointments or creams to the wound while stitches/staples are in place, as this may cause delayed healing. Return if you experience any of the following signs of infection: Swelling, redness, pus drainage, streaking, fever >101.0 F  Return if you experience excessive bleeding that does not stop after 15-20 minutes of constant, firm pressure.

## 2019-05-03 NOTE — ED Provider Notes (Signed)
[facility] EMERGENCY DEPARTMENT Provider Note   CSN: [id number] Arrival date & time: 05/03/19  1128     History   Chief Complaint Chief Complaint  Patient presents with  . Foot Injury    HPI Danielle Nolan is a 43 y.o. female with past medical history as listed below presents to emergency department today with chief complaint of laceration to right foot.  Patient states 4 hours prior to arrival her cat knocked a glass candle off the counter.  The candle shattered.  Patient did not see all the shards on the floor and unfortunately stepped on 1.  She had immediate pain.  She describes the pain as throbbing sensation.  She states the pain is constant.  Pain is worse with movement and putting pressure on her foot.  Rates the pain 8 out of 10 in severity.  She did not take anything for pain prior to arrival.  She thinks she saw a piece of glass still in her foot but has not attempted to remove it.  She reports tetanus is not up-to-date.  She denies fever, chills, numbness, tingling, decrease sensation.  Past Medical History:  Diagnosis Date  . Fibroid   . Ovarian cyst     Patient Active Problem List   Diagnosis Date Noted  . Acute cholecystitis 12/10/2017  . Other and unspecified ovarian cyst 01/27/2013    Past Surgical History:  Procedure Laterality Date  . BTL    . CHOLECYSTECTOMY N/A 12/11/2017   Procedure: LAPAROSCOPIC CHOLECYSTECTOMY WITH INTRAOPERATIVE CHOLANGIOGRAM;  Surgeon: Donnie Mesa, MD;  Location: [facility];  Service: General;  Laterality: N/A;  . Cryo Therapy     done at age 5.  Dx with cervical cancer     OB History    Gravida  4   Para  3   Term  3   Preterm      AB  1   Living  3     SAB  1   TAB      Ectopic      Multiple      Live Births               Home Medications    Prior to Admission medications   Medication Sig Start Date End Date Taking? Authorizing Provider  ciprofloxacin (CIPRO) 500 MG tablet Take 1  tablet (500 mg total) by mouth every 12 (twelve) hours for 7 days. 05/03/19 05/10/19  , Harley Hallmark, PA-C    Family History Family History  Problem Relation Age of Onset  . Hypertension Maternal Grandmother   . Heart disease Maternal Grandmother   . Cancer Maternal Grandmother   . Hypertension Maternal Grandfather   . Diabetes Maternal Grandfather   . Cancer Maternal Grandfather   . Hypertension Paternal Grandmother   . Hypertension Paternal Grandfather     Social History Social History   Tobacco Use  . Smoking status: Current Every Day Smoker    Packs/day: 1.00    Types: Cigarettes  . Smokeless tobacco: Never Used  Substance Use Topics  . Alcohol use: Yes    Comment: occasionally  . Drug use: No     Allergies   Patient has no known allergies.   Review of Systems Review of Systems  Constitutional: Negative for chills and fever.  Musculoskeletal: Positive for arthralgias. Negative for joint swelling and myalgias.  Skin: Positive for wound.  Neurological: Negative for weakness and numbness.     Physical Exam Updated Vital  Signs BP 128/86 (BP Location: Right Arm)   Pulse 72   Temp 98.8 F (37.1 C) (Oral)   Resp 18   Ht 5\' 7"  (1.702 m)   Wt 65.8 kg   SpO2 97%   BMI 22.71 kg/m   Physical Exam Vitals signs and nursing note reviewed.  Constitutional:      Appearance: She is well-developed. She is not ill-appearing or toxic-appearing.  HENT:     Head: Normocephalic and atraumatic.     Nose: Nose normal.  Eyes:     General: No scleral icterus.       Right eye: No discharge.        Left eye: No discharge.     Conjunctiva/sclera: Conjunctivae normal.  Neck:     Musculoskeletal: Normal range of motion.     Vascular: No JVD.  Cardiovascular:     Rate and Rhythm: Normal rate and regular rhythm.     Pulses: Normal pulses.     Heart sounds: Normal heart sounds.  Pulmonary:     Effort: Pulmonary effort is normal.     Breath sounds: Normal breath  sounds.  Abdominal:     General: There is no distension.  Musculoskeletal: Normal range of motion.       Feet:  Skin:    General: Skin is warm and dry.  Neurological:     Mental Status: She is oriented to person, place, and time.     GCS: GCS eye subscore is 4. GCS verbal subscore is 5. GCS motor subscore is 6.     Comments: Fluent speech, no facial droop.  Psychiatric:        Behavior: Behavior normal.      ED Treatments / Results  Labs (all labs ordered are listed, but only abnormal results are displayed) Labs Reviewed - No data to display  EKG None  Radiology Dg Foot Complete Right  Result Date: 05/03/2019 CLINICAL DATA:  Stepped on shards of glass this morning, question retained foreign body EXAM: RIGHT FOOT COMPLETE - 3+ VIEW COMPARISON:  None FINDINGS: Osseous mineralization normal. Joint spaces preserved. No acute fracture, dislocation, or bone destruction. Small plantar calcaneal spur. At the site of the marked wound, several small vague opacities are present, may be related to soft tissue wound/irregularity but small radiopaque foreign bodies are not excluded. Tiny rounded density may represent a soft tissue calcification, slightly more posterior. IMPRESSION: Vague opacities at marked site of wound at the plantar aspect of the midfoot, question irregular soft tissue but faint radiopaque foreign bodies not excluded. Electronically Signed   By: Lavonia Dana M.D.   On: 05/03/2019 12:55    Procedures .Marland KitchenLaceration Repair  Date/Time: 05/03/2019 5:02 PM Performed by: Cherre Robins, PA-C Authorized by: Cherre Robins, PA-C   Consent:    Consent obtained:  Verbal   Risks discussed:  Infection, retained foreign body and pain   Alternatives discussed:  No treatment Anesthesia (see MAR for exact dosages):    Anesthesia method:  Local infiltration   Local anesthetic:  Lidocaine 1% w/o epi Laceration details:    Location:  Foot   Foot location:  Sole of R foot    Length (cm):  1.5   Depth (mm):  2 Repair type:    Repair type:  Simple Pre-procedure details:    Preparation:  Patient was prepped and draped in usual sterile fashion and imaging obtained to evaluate for foreign bodies Exploration:    Hemostasis achieved with:  Direct pressure  Wound exploration: wound explored through full range of motion and entire depth of wound probed and visualized   Treatment:    Area cleansed with:  Shur-Clens   Amount of cleaning:  Extensive   Irrigation solution:  Sterile saline   Irrigation method:  Pressure wash   Visualized foreign bodies/material removed: yes   Skin repair:    Repair method:  Steri-Strips Approximation:    Approximation:  Loose Post-procedure details:    Dressing:  Bulky dressing   Patient tolerance of procedure:  Tolerated well, no immediate complications   (including critical care time)  Medications Ordered in ED Medications  lidocaine (PF) (XYLOCAINE) 1 % injection 30 mL (has no administration in time range)  ciprofloxacin (CIPRO) tablet 500 mg (has no administration in time range)  oxyCODONE-acetaminophen (PERCOCET/ROXICET) 5-325 MG per tablet 1 tablet (1 tablet Oral Given 05/03/19 1536)  ondansetron (ZOFRAN) tablet 4 mg (4 mg Oral Given 05/03/19 1536)  Tdap (BOOSTRIX) injection 0.5 mL (0.5 mLs Intramuscular Given 05/03/19 1539)     Initial Impression / Assessment and Plan / ED Course  I have reviewed the triage vital signs and the nursing notes.  Pertinent labs & imaging results that were available during my care of the patient were reviewed by me and considered in my medical decision making (see chart for details).  Patient presents to the emergency department with laceration to sole of right foot which occurred within 8 hours PTA. Patient nontoxic appearing, resting comfortably. X-ray of right foot viewed by me shows vague opacities in the plantar aspect of the right midfoot, concerning for foreign body.  Patient soak  foot in sterile water.  After soaking there is a large piece of glass that came out of laceration.  Pressure irrigation performed. Wound explored and base of wound visualized in a bloodless field without evidence of any further foreign body.  Discussed with patient there is possible foreign body in the foot however after thorough irrigation I am not able to see or remove any other pieces.  Patient is aware of the risks of possible foreign body.  Patient declining sutures.  She is very anxious, discussed with her Steri-Strips are another option to close as laceration is superficial. Laceration repair per procedure note above, tolerated well. Tetanus updated at today's visit.  Will cover with antibiotics given possible foreign body and laceration to foot.  Discussed wound home care as well as need for wound recheck in 2 days with PCP or emergency department.  I discussed results, treatment plan, need for follow-up, and strict return precautions with the patient including signs of infection. Provided opportunity for questions, patient confirmed understanding and is in agreement with plan.    Portions of this note were generated with [employer]. Dictation errors may occur despite best attempts at proofreading.  Final Clinical Impressions(s) / ED Diagnoses   Final diagnoses:  Laceration of right foot, initial encounter    ED Discharge Orders         Ordered    ciprofloxacin (CIPRO) 500 MG tablet  Every 12 hours     05/03/19 1656           Cherre Robins, PA-C 05/03/19 1707    Tegeler, Gwenyth Allegra, MD 05/04/19 1052

## 2020-02-24 ENCOUNTER — Inpatient Hospital Stay (HOSPITAL_COMMUNITY): Payer: Self-pay

## 2020-02-24 ENCOUNTER — Inpatient Hospital Stay (HOSPITAL_COMMUNITY): Payer: Self-pay | Admitting: Anesthesiology

## 2020-02-24 ENCOUNTER — Other Ambulatory Visit: Payer: Self-pay

## 2020-02-24 ENCOUNTER — Encounter (HOSPITAL_COMMUNITY): Payer: Self-pay | Admitting: Emergency Medicine

## 2020-02-24 ENCOUNTER — Encounter (HOSPITAL_COMMUNITY)
Admission: EM | Discharge status: Against Medical Advice | Payer: Self-pay | Source: Home / Self Care | Attending: Internal Medicine

## 2020-02-24 ENCOUNTER — Emergency Department (HOSPITAL_COMMUNITY): Payer: Self-pay

## 2020-02-24 ENCOUNTER — Inpatient Hospital Stay (HOSPITAL_COMMUNITY)
Admission: EM | Admit: 2020-02-24 | Discharge: 2020-02-28 | DRG: 573 | Discharge status: Against Medical Advice | Payer: Self-pay | Attending: Student in an Organized Health Care Education/Training Program | Admitting: Student in an Organized Health Care Education/Training Program

## 2020-02-24 DIAGNOSIS — B950 Streptococcus, group A, as the cause of diseases classified elsewhere: Secondary | ICD-10-CM | POA: Diagnosis present

## 2020-02-24 DIAGNOSIS — E876 Hypokalemia: Secondary | ICD-10-CM | POA: Diagnosis present

## 2020-02-24 DIAGNOSIS — R438 Other disturbances of smell and taste: Secondary | ICD-10-CM | POA: Diagnosis present

## 2020-02-24 DIAGNOSIS — Z8541 Personal history of malignant neoplasm of cervix uteri: Secondary | ICD-10-CM

## 2020-02-24 DIAGNOSIS — W268XXA Contact with other sharp object(s), not elsewhere classified, initial encounter: Secondary | ICD-10-CM

## 2020-02-24 DIAGNOSIS — Z833 Family history of diabetes mellitus: Secondary | ICD-10-CM

## 2020-02-24 DIAGNOSIS — I96 Gangrene, not elsewhere classified: Secondary | ICD-10-CM | POA: Diagnosis present

## 2020-02-24 DIAGNOSIS — N390 Urinary tract infection, site not specified: Secondary | ICD-10-CM | POA: Diagnosis present

## 2020-02-24 DIAGNOSIS — Z8249 Family history of ischemic heart disease and other diseases of the circulatory system: Secondary | ICD-10-CM

## 2020-02-24 DIAGNOSIS — F40240 Claustrophobia: Secondary | ICD-10-CM | POA: Diagnosis present

## 2020-02-24 DIAGNOSIS — L02519 Cutaneous abscess of unspecified hand: Secondary | ICD-10-CM | POA: Diagnosis present

## 2020-02-24 DIAGNOSIS — F17210 Nicotine dependence, cigarettes, uncomplicated: Secondary | ICD-10-CM | POA: Diagnosis present

## 2020-02-24 DIAGNOSIS — L03012 Cellulitis of left finger: Secondary | ICD-10-CM | POA: Diagnosis present

## 2020-02-24 DIAGNOSIS — U071 COVID-19: Secondary | ICD-10-CM

## 2020-02-24 DIAGNOSIS — M79642 Pain in left hand: Secondary | ICD-10-CM

## 2020-02-24 DIAGNOSIS — R0602 Shortness of breath: Secondary | ICD-10-CM

## 2020-02-24 DIAGNOSIS — L03119 Cellulitis of unspecified part of limb: Secondary | ICD-10-CM

## 2020-02-24 DIAGNOSIS — Z20822 Contact with and (suspected) exposure to covid-19: Secondary | ICD-10-CM

## 2020-02-24 DIAGNOSIS — Z809 Family history of malignant neoplasm, unspecified: Secondary | ICD-10-CM

## 2020-02-24 DIAGNOSIS — Z5329 Procedure and treatment not carried out because of patient's decision for other reasons: Secondary | ICD-10-CM | POA: Diagnosis present

## 2020-02-24 DIAGNOSIS — L02512 Cutaneous abscess of left hand: Principal | ICD-10-CM | POA: Diagnosis present

## 2020-02-24 DIAGNOSIS — M65142 Other infective (teno)synovitis, left hand: Secondary | ICD-10-CM | POA: Diagnosis present

## 2020-02-24 DIAGNOSIS — R7401 Elevation of levels of liver transaminase levels: Secondary | ICD-10-CM | POA: Diagnosis present

## 2020-02-24 HISTORY — PX: I & D EXTREMITY: SHX5045

## 2020-02-24 LAB — URINALYSIS, ROUTINE W REFLEX MICROSCOPIC
Bilirubin Urine: NEGATIVE
Glucose, UA: NEGATIVE mg/dL
Ketones, ur: NEGATIVE mg/dL
Nitrite: POSITIVE — AB
Protein, ur: 30 mg/dL — AB
Specific Gravity, Urine: 1.03 (ref 1.005–1.030)
WBC, UA: >50 WBC/hpf — ABNORMAL HIGH (ref 0–5)
pH: 5 (ref 5.0–8.0)

## 2020-02-24 LAB — COMPREHENSIVE METABOLIC PANEL
ALT: 20 U/L (ref 0–44)
AST: 25 U/L (ref 15–41)
Albumin: 3.1 g/dL — ABNORMAL LOW (ref 3.5–5.0)
Alkaline Phosphatase: 57 U/L (ref 38–126)
Anion gap: 10 (ref 5–15)
BUN: 9 mg/dL (ref 6–20)
CO2: 23 mmol/L (ref 22–32)
Calcium: 8.6 mg/dL — ABNORMAL LOW (ref 8.9–10.3)
Chloride: 104 mmol/L (ref 98–111)
Creatinine, Ser: 0.65 mg/dL (ref 0.44–1.00)
GFR calc Af Amer: >60 mL/min (ref >60)
GFR calc non Af Amer: >60 mL/min (ref >60)
Glucose, Bld: 116 mg/dL — ABNORMAL HIGH (ref 70–99)
Potassium: 3.3 mmol/L — ABNORMAL LOW (ref 3.5–5.1)
Sodium: 137 mmol/L (ref 135–145)
Total Bilirubin: 0.2 mg/dL — ABNORMAL LOW (ref 0.3–1.2)
Total Protein: 5.9 g/dL — ABNORMAL LOW (ref 6.5–8.1)

## 2020-02-24 LAB — CBC WITH DIFFERENTIAL/PLATELET
Abs Immature Granulocytes: 0.06 10*3/uL (ref 0.00–0.07)
Basophils Absolute: 0 10*3/uL (ref 0.0–0.1)
Basophils Relative: 0 %
Eosinophils Absolute: 0 10*3/uL (ref 0.0–0.5)
Eosinophils Relative: 0 %
HCT: 33.8 % — ABNORMAL LOW (ref 36.0–46.0)
Hemoglobin: 11.4 g/dL — ABNORMAL LOW (ref 12.0–15.0)
Immature Granulocytes: 1 %
Lymphocytes Relative: 9 %
Lymphs Abs: 1.1 10*3/uL (ref 0.7–4.0)
MCH: 28 pg (ref 26.0–34.0)
MCHC: 33.7 g/dL (ref 30.0–36.0)
MCV: 83 fL (ref 80.0–100.0)
Monocytes Absolute: 0.8 10*3/uL (ref 0.1–1.0)
Monocytes Relative: 7 %
Neutro Abs: 9.7 10*3/uL — ABNORMAL HIGH (ref 1.7–7.7)
Neutrophils Relative %: 83 %
Platelets: 120 10*3/uL — ABNORMAL LOW (ref 150–400)
RBC: 4.07 MIL/uL (ref 3.87–5.11)
RDW: 12.4 % (ref 11.5–15.5)
WBC: 11.7 10*3/uL — ABNORMAL HIGH (ref 4.0–10.5)
nRBC: 0 % (ref 0.0–0.2)

## 2020-02-24 LAB — RAPID URINE DRUG SCREEN, HOSP PERFORMED
Amphetamines: POSITIVE — AB
Barbiturates: NOT DETECTED
Benzodiazepines: NOT DETECTED
Cocaine: NOT DETECTED
Opiates: NOT DETECTED
Tetrahydrocannabinol: POSITIVE — AB

## 2020-02-24 LAB — MAGNESIUM: Magnesium: 1.7 mg/dL (ref 1.7–2.4)

## 2020-02-24 LAB — I-STAT BETA HCG BLOOD, ED (MC, WL, AP ONLY): I-stat hCG, quantitative: <5 m[IU]/mL (ref <5)

## 2020-02-24 LAB — LACTATE DEHYDROGENASE: LDH: 156 U/L (ref 98–192)

## 2020-02-24 LAB — SARS CORONAVIRUS 2 BY RT PCR (HOSPITAL ORDER, PERFORMED IN ~~LOC~~ HOSPITAL LAB): SARS Coronavirus 2: POSITIVE — AB

## 2020-02-24 LAB — FIBRINOGEN: Fibrinogen: 538 mg/dL — ABNORMAL HIGH (ref 210–475)

## 2020-02-24 LAB — PROCALCITONIN: Procalcitonin: <0.1 ng/mL

## 2020-02-24 LAB — D-DIMER, QUANTITATIVE: D-Dimer, Quant: 0.53 ug/mL-FEU — ABNORMAL HIGH (ref 0.00–0.50)

## 2020-02-24 LAB — SURGICAL PCR SCREEN
MRSA, PCR: NEGATIVE
Staphylococcus aureus: POSITIVE — AB

## 2020-02-24 LAB — LACTIC ACID, PLASMA: Lactic Acid, Venous: 1.8 mmol/L (ref 0.5–1.9)

## 2020-02-24 LAB — FERRITIN: Ferritin: 72 ng/mL (ref 11–307)

## 2020-02-24 SURGERY — IRRIGATION AND DEBRIDEMENT EXTREMITY
Anesthesia: General | Site: Hand | Laterality: Left

## 2020-02-24 MED ORDER — SODIUM CHLORIDE 0.9 % IV SOLN
1.0000 g | INTRAVENOUS | Status: AC
  Administered 2020-02-24 – 2020-02-27 (×4): 1 g via INTRAVENOUS
  Filled 2020-02-24: qty 10
  Filled 2020-02-24: qty 1
  Filled 2020-02-24: qty 10
  Filled 2020-02-24: qty 1

## 2020-02-24 MED ORDER — PROPOFOL 10 MG/ML IV BOLUS
INTRAVENOUS | Status: AC
  Filled 2020-02-24: qty 40

## 2020-02-24 MED ORDER — CHLORHEXIDINE GLUCONATE 4 % EX LIQD
60.0000 mL | Freq: Once | CUTANEOUS | Status: DC
  Filled 2020-02-24: qty 60

## 2020-02-24 MED ORDER — POVIDONE-IODINE 10 % EX SWAB: 2.0000 "application " | Freq: Once | CUTANEOUS | Status: DC

## 2020-02-24 MED ORDER — FENTANYL CITRATE (PF) 100 MCG/2ML IJ SOLN
INTRAMUSCULAR | Status: AC
Start: 2020-02-24 — End: 2020-02-25
  Filled 2020-02-24: qty 2

## 2020-02-24 MED ORDER — ONDANSETRON HCL 4 MG/2ML IJ SOLN
INTRAMUSCULAR | Status: DC | PRN
  Administered 2020-02-24: 4 mg via INTRAVENOUS

## 2020-02-24 MED ORDER — CEPHALEXIN 250 MG PO CAPS
500.0000 mg | ORAL_CAPSULE | Freq: Once | ORAL | Status: AC
  Administered 2020-02-24: 500 mg via ORAL
  Filled 2020-02-24: qty 2

## 2020-02-24 MED ORDER — MIDAZOLAM HCL 5 MG/5ML IJ SOLN
INTRAMUSCULAR | Status: DC | PRN
  Administered 2020-02-24: 2 mg via INTRAVENOUS

## 2020-02-24 MED ORDER — PROPOFOL 10 MG/ML IV BOLUS
INTRAVENOUS | Status: DC | PRN
  Administered 2020-02-24: 160 mg via INTRAVENOUS

## 2020-02-24 MED ORDER — ENOXAPARIN SODIUM 40 MG/0.4ML ~~LOC~~ SOLN
40.0000 mg | SUBCUTANEOUS | Status: DC
  Administered 2020-02-25 – 2020-02-26 (×2): 40 mg via SUBCUTANEOUS
  Filled 2020-02-24 (×2): qty 0.4

## 2020-02-24 MED ORDER — LIDOCAINE HCL (CARDIAC) PF 100 MG/5ML IV SOSY
PREFILLED_SYRINGE | INTRAVENOUS | Status: DC | PRN
  Administered 2020-02-24: 60 mg via INTRATRACHEAL

## 2020-02-24 MED ORDER — MORPHINE SULFATE (PF) 4 MG/ML IV SOLN
4.0000 mg | Freq: Once | INTRAVENOUS | Status: AC
  Administered 2020-02-24: 4 mg via INTRAVENOUS
  Filled 2020-02-24: qty 1

## 2020-02-24 MED ORDER — CEFAZOLIN SODIUM-DEXTROSE 2-4 GM/100ML-% IV SOLN
2.0000 g | Freq: Once | INTRAVENOUS | Status: AC
  Administered 2020-02-24: 2 g via INTRAVENOUS
  Filled 2020-02-24: qty 100

## 2020-02-24 MED ORDER — OXYCODONE HCL 5 MG PO TABS: 5.0000 mg | ORAL_TABLET | Freq: Once | ORAL | Status: DC | PRN

## 2020-02-24 MED ORDER — SODIUM CHLORIDE 0.9 % IR SOLN
Status: DC | PRN
  Administered 2020-02-24: 3000 mL
  Administered 2020-02-24: 1000 mL

## 2020-02-24 MED ORDER — SUCCINYLCHOLINE CHLORIDE 20 MG/ML IJ SOLN
INTRAMUSCULAR | Status: DC | PRN
  Administered 2020-02-24: 100 mg via INTRAVENOUS

## 2020-02-24 MED ORDER — GUAIFENESIN-DM 100-10 MG/5ML PO SYRP: 10.0000 mL | ORAL_SOLUTION | ORAL | Status: DC | PRN

## 2020-02-24 MED ORDER — LACTATED RINGERS IV SOLN: INTRAVENOUS | Status: DC | PRN

## 2020-02-24 MED ORDER — ACETAMINOPHEN 325 MG PO TABS: 650.0000 mg | ORAL_TABLET | Freq: Four times a day (QID) | ORAL | Status: DC | PRN

## 2020-02-24 MED ORDER — FENTANYL CITRATE (PF) 250 MCG/5ML IJ SOLN
INTRAMUSCULAR | Status: AC
  Filled 2020-02-24: qty 5

## 2020-02-24 MED ORDER — MIDAZOLAM HCL 2 MG/2ML IJ SOLN
INTRAMUSCULAR | Status: AC
  Filled 2020-02-24: qty 2

## 2020-02-24 MED ORDER — FENTANYL CITRATE (PF) 100 MCG/2ML IJ SOLN
25.0000 ug | INTRAMUSCULAR | Status: DC | PRN
  Administered 2020-02-24 (×2): 50 ug via INTRAVENOUS

## 2020-02-24 MED ORDER — OXYCODONE HCL 5 MG/5ML PO SOLN: 5.0000 mg | Freq: Once | ORAL | Status: DC | PRN

## 2020-02-24 MED ORDER — POTASSIUM CHLORIDE 2 MEQ/ML IV SOLN
INTRAVENOUS | Status: AC
  Filled 2020-02-24: qty 1000

## 2020-02-24 MED ORDER — FENTANYL CITRATE (PF) 250 MCG/5ML IJ SOLN
INTRAMUSCULAR | Status: DC | PRN
  Administered 2020-02-24: 50 ug via INTRAVENOUS

## 2020-02-24 MED ORDER — AMISULPRIDE (ANTIEMETIC) 5 MG/2ML IV SOLN: 10.0000 mg | Freq: Once | INTRAVENOUS | Status: DC | PRN

## 2020-02-24 MED ORDER — HYDROMORPHONE HCL 1 MG/ML IJ SOLN
0.5000 mg | INTRAMUSCULAR | Status: DC | PRN
  Administered 2020-02-24 – 2020-02-26 (×5): 0.5 mg via INTRAVENOUS
  Filled 2020-02-24 (×5): qty 1

## 2020-02-24 MED ORDER — BUPIVACAINE HCL (PF) 0.25 % IJ SOLN
INTRAMUSCULAR | Status: AC
  Filled 2020-02-24: qty 30

## 2020-02-24 MED ORDER — POTASSIUM CHLORIDE CRYS ER 20 MEQ PO TBCR
40.0000 meq | EXTENDED_RELEASE_TABLET | Freq: Once | ORAL | Status: AC
  Administered 2020-02-24: 40 meq via ORAL
  Filled 2020-02-24: qty 2

## 2020-02-24 SURGICAL SUPPLY — 40 items
BNDG CONFORM 2 STRL LF (GAUZE/BANDAGES/DRESSINGS) ×1 IMPLANT
BNDG ELASTIC 4X5.8 VLCR STR LF (GAUZE/BANDAGES/DRESSINGS) ×2 IMPLANT
BNDG GAUZE ELAST 4 BULKY (GAUZE/BANDAGES/DRESSINGS) ×4 IMPLANT
CORD BIPOLAR FORCEPS 12FT (ELECTRODE) ×2 IMPLANT
COVER SURGICAL LIGHT HANDLE (MISCELLANEOUS) ×2 IMPLANT
COVER WAND RF STERILE (DRAPES) ×2 IMPLANT
CUFF TOURN SGL QUICK 18X4 (TOURNIQUET CUFF) ×2 IMPLANT
DRSG ADAPTIC 3X8 NADH LF (GAUZE/BANDAGES/DRESSINGS) ×2 IMPLANT
GAUZE SPONGE 4X4 12PLY STRL (GAUZE/BANDAGES/DRESSINGS) ×2 IMPLANT
GAUZE XEROFORM 1X8 LF (GAUZE/BANDAGES/DRESSINGS) ×2 IMPLANT
GLOVE BIOGEL M 8.0 STRL (GLOVE) ×2 IMPLANT
GLOVE SS BIOGEL STRL SZ 8 (GLOVE) ×1 IMPLANT
GLOVE SUPERSENSE BIOGEL SZ 8 (GLOVE) ×1
GOWN STRL REUS W/ TWL LRG LVL3 (GOWN DISPOSABLE) ×1 IMPLANT
GOWN STRL REUS W/ TWL XL LVL3 (GOWN DISPOSABLE) ×2 IMPLANT
GOWN STRL REUS W/TWL LRG LVL3 (GOWN DISPOSABLE) ×2
GOWN STRL REUS W/TWL XL LVL3 (GOWN DISPOSABLE) ×4
KIT BASIN OR (CUSTOM PROCEDURE TRAY) ×2 IMPLANT
KIT TURNOVER KIT B (KITS) ×2 IMPLANT
LOOP VESSEL MAXI BLUE (MISCELLANEOUS) ×1 IMPLANT
MANIFOLD NEPTUNE II (INSTRUMENTS) ×2 IMPLANT
NS IRRIG 1000ML POUR BTL (IV SOLUTION) ×2 IMPLANT
PACK ORTHO EXTREMITY (CUSTOM PROCEDURE TRAY) ×2 IMPLANT
PAD ARMBOARD 7.5X6 YLW CONV (MISCELLANEOUS) ×2 IMPLANT
PAD CAST 4YDX4 CTTN HI CHSV (CAST SUPPLIES) ×1 IMPLANT
PADDING CAST COTTON 4X4 STRL (CAST SUPPLIES) ×2
PILLOW ARM CARTER ADULT (MISCELLANEOUS) ×1 IMPLANT
SET CYSTO W/LG BORE CLAMP LF (SET/KITS/TRAYS/PACK) ×2 IMPLANT
SOL PREP POV-IOD 4OZ 10% (MISCELLANEOUS) ×4 IMPLANT
SPONGE LAP 4X18 RFD (DISPOSABLE) ×2 IMPLANT
SUT CHROMIC 4 0 PS 2 18 (SUTURE) ×1 IMPLANT
SUT PROLENE 4 0 RB 1 (SUTURE) ×2
SUT PROLENE 4-0 RB1 18X2 ARM (SUTURE) IMPLANT
SWAB COLLECTION DEVICE MRSA (MISCELLANEOUS) ×2 IMPLANT
SWAB CULTURE ESWAB REG 1ML (MISCELLANEOUS) ×2 IMPLANT
TOWEL GREEN STERILE (TOWEL DISPOSABLE) ×2 IMPLANT
TOWEL GREEN STERILE FF (TOWEL DISPOSABLE) ×2 IMPLANT
TUBE CONNECTING 12X1/4 (SUCTIONS) ×2 IMPLANT
WATER STERILE IRR 1000ML POUR (IV SOLUTION) ×2 IMPLANT
YANKAUER SUCT BULB TIP NO VENT (SUCTIONS) ×2 IMPLANT

## 2020-02-24 NOTE — ED Triage Notes (Signed)
Pt reports she cut her finger on a can lid x 2 days ago. Left hand swollen, red and warm to the touch.

## 2020-02-24 NOTE — Consult Note (Signed)
Please see extensive consultation note from my physician assistant Mr. Gorden Harms.  I reviewed her chart and findings in great detail.  The patient has a significant infection about her ring finger.  She refused the MRI scan and thus we are simply going to move forward with the exploration.  I feel her findings are consistent with a deep abscess with tenosynovitis involvement.  We will perform a thorough exploration with debridement.  I discussed these issues with the patient and she desires to proceed.  We are planning surgery for your upper extremity. The risk and benefits of surgery to include risk of bleeding, infection, anesthesia,  damage to normal structures and failure of the surgery to accomplish its intended goals of relieving symptoms and restoring function have been discussed in detail. With this in mind we plan to proceed. I have specifically discussed with the patient the pre-and postoperative regime and the dos and don'ts and risk and benefits in great detail. Risk and benefits of surgery also include risk of dystrophy(CRPS), chronic nerve pain, failure of the healing process to go onto completion and other inherent risks of surgery The relavent the pathophysiology of the disease/injury process, as well as the alternatives for treatment and postoperative course of action has been discussed in great detail with the patient who desires to proceed.  We will do everything in our power to help you (the patient) restore function to the upper extremity. It is a pleasure to see this patient today.   Bayan Hedstrom MD

## 2020-02-24 NOTE — Progress Notes (Signed)
Pharmacy Antibiotic Note  Danielle Nolan is a 44 y.o. female admitted on 02/24/2020 with right ring finger abscess s/p I&D.  Pharmacy has been consulted for vancomycin  Dosing. -WBC= 11.7, SCr= 0.65, CrCl ~ 90 -wt= 66kg  Plan: -Vancomycin 1000mg  IV q12h  -Will follow renal function, cultures and clinical progress      Temp (24hrs), Avg:98.4 F (36.9 C), Min:97.5 F (36.4 C), Max:99.3 F (37.4 C)  Recent Labs  Lab 02/24/20 0144  WBC 11.7*  CREATININE 0.65  LATICACIDVEN 1.8    CrCl cannot be calculated (Unknown ideal weight.).    No Known Allergies  Antimicrobials this admission: 9/10 vanc 9/10 rocephin  Dose adjustments this admission:   Microbiology results: 9/10 blood x2 9/10 urine  Thank you for allowing pharmacy to be a part of this patient's care.  Hildred Laser, PharmD Clinical Pharmacist **Pharmacist phone directory can now be found on [email or web address] ([email or web address]).  Listed under [place].

## 2020-02-24 NOTE — Transfer of Care (Signed)
Immediate Anesthesia Transfer of Care Note  Patient: Danielle Nolan  Procedure(s) Performed: IRRIGATION AND DEBRIDEMENT EXTREMITY (Left )  Patient Location: PACU  Anesthesia Type:General  Level of Consciousness: awake  Airway & Oxygen Therapy: Patient Spontanous Breathing and Patient connected to face mask oxygen  Post-op Assessment: Report given to RN and Post -op Vital signs reviewed and stable  Post vital signs: Reviewed and stable  Last Vitals:  Vitals Value Taken Time  BP    Temp    Pulse    Resp    SpO2      Last Pain:  Vitals:   02/24/20 2022  TempSrc: Oral  PainSc:          Complications: No complications documented.

## 2020-02-24 NOTE — Anesthesia Procedure Notes (Signed)
Procedure Name: Intubation Date/Time: 02/24/2020 10:41 PM Performed by: Clovis Cao, CRNA Pre-anesthesia Checklist: Patient identified, Emergency Drugs available, Suction available, Patient being monitored and Timeout performed Patient Re-evaluated:Patient Re-evaluated prior to induction Oxygen Delivery Method: Circle system utilized Preoxygenation: Pre-oxygenation with 100% oxygen Induction Type: IV induction, Rapid sequence and Cricoid Pressure applied Laryngoscope Size: Glidescope (Elective Glide ) Grade View: Grade I Tube type: Oral Tube size: 7.5 mm Number of attempts: 1 Airway Equipment and Method: Stylet and Video-laryngoscopy Placement Confirmation: ETT inserted through vocal cords under direct vision,  positive ETCO2 and breath sounds checked- equal and bilateral Secured at: 22 cm Tube secured with: Tape Dental Injury: Teeth and Oropharynx as per pre-operative assessment

## 2020-02-24 NOTE — H&P (Signed)
Date: 02/24/2020               Patient Name:  Danielle Nolan MRN: [id number]  DOB: [birth year] Age / Sex: 44 y.o., female   PCP: Patient, No Pcp Per         Medical Service: Internal Medicine Teaching Service         Attending Physician: Dr. Heber Frazer, Rachel Moulds, DO    First Contact: Dr. Sharon Seller Pager: [phone]            After Hours (After 5p/  First Contact Pager: [phone]  weekends / holidays): Second Contact Pager: [phone]   Chief Complaint: hand pain  History of Present Illness:   Danielle Nolan is a 44yo female with no known past medical history presenting with redness, pain, swelling and tightness of her left hand after cutting it on a can lid three days ago. Soon after she began to develop the pain redness and swelling. She denies current fever or chills. She states she is feeling very tired because she recently received some pain medication that has helped her hand although it feels very tight.  She also has recently had increased cough but denies shortness of breath. She was tested for covid and was found to be positive. She is not sure how she got the virus as she has mostly just been home alone. She has not had the vaccination. She denies chest pain, pleurisy, nausea, vomiting, abdominal pain. She has had some non-bloody diarrhea with up to five episodes per day. She endorses loss of smell.  She denies dizziness, dysuria, difficulty urinating, or increased urinary frequency.   Social:   She lives in [place] She smokes 1 pack to 1.5 ppd  She smokes marijuana and occasionally uses methamphetamines. She last used several weeks ago.   Family History:   Grandparents: Hypertension Heart disease, unsure of specific diagnosis, runs in her whole family.    Meds:  Current Meds  Medication Sig  . Ferrous Sulfate (IRON PO) Take 1 tablet by mouth daily.  Marland Kitchen ibuprofen (ADVIL) 200 MG tablet Take 400 mg by mouth every 6 (six) hours as needed for fever, headache or mild pain.    . Multiple Vitamin (MULTIVITAMIN WITH MINERALS) TABS tablet Take 1 tablet by mouth daily.     Allergies: Allergies as of 02/24/2020  . (No Known Allergies)   Past Medical History:  Diagnosis Date  . Fibroid   . Ovarian cyst      Review of Systems: A complete ROS was negative except as per HPI.   Physical Exam: Blood pressure 101/67, pulse (!) 54, temperature 99.3 F (37.4 C), temperature source Oral, resp. rate 18, SpO2 100 %.  Constitution: difficult to rouse, NAD HENT: [place]/AT Cardio: RRR, no m/r/g, trace LE edema Respiratory: CTA, no wheezing, rales or rhonchi  Abdominal: soft, non-distended, NTTP, +BS MSK: moving all extremities, limited ROM left hand finger flexion and extension, especially the 4th digit Neuro: drowsy but orientedx3, pleasant Skin: +erythema, warmth, swelling, and bruising 4th digit left hand, very TTP, sensation intact proximally and distally.         EKG: pending  CXR: pending  Assessment & Plan by Problem: Active Problems:   Cellulitis and abscess of hand   Left Hand Cellulitis Likely with tenosynovitis. Orthopedics consulted in the ER, possible surgery this evening after MRI of left hand. Received ancef in the ER. Possible systemic signs with elevated temperature and leukocytosis, but some of this may be attributed to  her covid infection as well.   - ceftriaxone 1 g qd - dilaudid prn  - LR + K 75cc/hr 1L - NPO  - f/u MRI hand  - ECG  Covid-19 Cough with no current SOB but was endorsed earlier. She is not requiring any supplemental oxygen currently. Symptomatic treatment currently.   - robitussen prn q4h  - maintain oxygen saturation >92%  - inflammatory labs ordered, will trend labs  - chest xray   Bacteruria Hematuria UA significant for leuks, nitrites, bacteria, hematuria and proteinuria. She is asymptomatic. Will need to clarify with her if she is having her menstrual cycle, otherwise will need f/u UA.   - f/u urine  culture  - covered with ceftriaxone     Diet: NPO  VTE: lovenox (starting tomorrow after surgery) IVF: LR+K Code: full - she is unsure about her preferred code status. She would like to be full code for now but would like to readdress this in the morning.   Dispo: Admit patient to Inpatient with expected length of stay greater than 2 midnights.  SignedMarty Heck, DO 02/24/2020, 10:41 AM  Pager: [phone]

## 2020-02-24 NOTE — Plan of Care (Signed)
progressing 

## 2020-02-24 NOTE — Progress Notes (Signed)
Patient arrived to unit via stretcher stable with no signs of acute distress. Patient was advised of belongings policy. Patient has small book bag and cell phone at bedside.

## 2020-02-24 NOTE — ED Notes (Signed)
Pt back from X-ray.  

## 2020-02-24 NOTE — Progress Notes (Signed)
Pt off unit to OR for exploration and debridement. Pt A/O x4. Belongings: clothing, partial plate and tongue ring left at bedside.

## 2020-02-24 NOTE — Op Note (Signed)
Operative note 02/24/2020  Roseanne Kaufman MD  Preoperative diagnosis right ring finger abscess with extensor tenosynovitis infectious in nature  Postop diagnosis the same  Operative procedure #1 irrigation debridement deep abscess left ring finger skin subtenons tissue tendon and peritenon tissue were debrided with curette knife and scissor this was an excisional debridement #2 radical tenolysis tenosynovectomy extensor apparatus secondary to infectious tenosynovitis  Jurgen Groeneveld MD  General anesthesia  Estimated blood loss minimal  Cultures fluid and tissue cultures taken  Description of procedure: Patient is Covid positive she presented to the ER with a deep abscess for many days.  She is now ready to proceed with surgical decompression I discussed with patient risk and benefits of surgery and other issues germane to her predicament.  With this in mind we will proceed accordingly.  Patient was seen by myself counseled and taken to the operative theater.  She underwent smooth induction of anesthesia.  Following this she was prepped and draped with Hibiclens scrub followed by 10-minute surgical Betadine scrub.  Following this timeout was observed and the patient then underwent a evaluation of the finger a mid lateral incision was made without difficulty.  Skin flaps were elevated obvious purulent material aggressing the dorsal extensor apparatus and this was debrided.  Aerobic and anaerobic fluid cultures were sent for culture and fungal and atypical cultures were sent as well.  Following this made the skin flaps as nicely shaped as possible to allow for later closure.  I then performed a radical extensor tenolysis tenosynovectomy.  The patient had a large amount of devitalized fat tissue and peritenon tissue about the extensor apparatus.  This required radical debridement which the patient tolerated well  This was an extensor tendon tenolysis tenosynovectomy.  Following this 4 L of saline were  placed through and through the area followed by loose closure over drain and a bandage soft in nature.  We will monitor her condition closely.  I did also debride and excoriation about the thumb which was superficial only.  I will add additional antibiotic in the form of vancomycin to the Rocephin regime as I am concerned about MRSA in this patient  We will monitor her condition closely.  This is very significant and involved infection throughout the finger.  Marili Vader MD

## 2020-02-24 NOTE — Consult Note (Signed)
Reason for Consult:Left hand infection Referring Physician: D Rabia Danielle Nolan is an 44 y.o. female.  HPI: Danielle Nolan cut her left ring finger on a can lid on Tuesday. She washed it thoroughly immediately and has been treating it at home but it has continued to swell and get more painful. She was sick with a respiratory infection prior to this so has had fevers, chills, and nausea but unsure if any of that is from the infection in her hand. She is LHD and currently not working.  Past Medical History:  Diagnosis Date   Fibroid    Ovarian cyst     Past Surgical History:  Procedure Laterality Date   BTL     CHOLECYSTECTOMY N/A 12/11/2017   Procedure: LAPAROSCOPIC CHOLECYSTECTOMY WITH INTRAOPERATIVE CHOLANGIOGRAM;  Surgeon: Donnie Mesa, MD;  Location: [facility];  Service: General;  Laterality: N/A;   Cryo Therapy     done at age 78.  Dx with cervical cancer    Family History  Problem Relation Age of Onset   Hypertension Maternal Grandmother    Heart disease Maternal Grandmother    Cancer Maternal Grandmother    Hypertension Maternal Grandfather    Diabetes Maternal Grandfather    Cancer Maternal Grandfather    Hypertension Paternal Grandmother    Hypertension Paternal Grandfather     Social History:  reports that she has been smoking cigarettes. She has been smoking about 1.00 pack per day. She has never used smokeless tobacco. She reports current alcohol use. She reports that she does not use drugs.  Allergies: No Known Allergies  Medications: I have reviewed the patient's current medications.  Results for orders placed or performed during the hospital encounter of 02/24/20 (from the past 48 hour(s))  Lactic acid, plasma     Status: None   Collection Time: 02/24/20  1:44 AM  Result Value Ref Range   Lactic Acid, Venous 1.8 0.5 - 1.9 mmol/L    Comment: Performed at [facility], [address] [place], [place] [address]  Comprehensive metabolic panel      Status: Abnormal   Collection Time: 02/24/20  1:44 AM  Result Value Ref Range   Sodium 137 135 - 145 mmol/L   Potassium 3.3 (L) 3.5 - 5.1 mmol/L   Chloride 104 98 - 111 mmol/L   CO2 23 22 - 32 mmol/L   Glucose, Bld 116 (H) 70 - 99 mg/dL    Comment: Glucose reference range applies only to samples taken after fasting for at least 8 hours.   BUN 9 6 - 20 mg/dL   Creatinine, Ser 0.65 0.44 - 1.00 mg/dL   Calcium 8.6 (L) 8.9 - 10.3 mg/dL   Total Protein 5.9 (L) 6.5 - 8.1 g/dL   Albumin 3.1 (L) 3.5 - 5.0 g/dL   AST 25 15 - 41 U/L   ALT 20 0 - 44 U/L   Alkaline Phosphatase 57 38 - 126 U/L   Total Bilirubin 0.2 (L) 0.3 - 1.2 mg/dL   GFR calc non Af Amer >60 >60 mL/min   GFR calc Af Amer >60 >60 mL/min   Anion gap 10 5 - 15    Comment: Performed at [facility], [place] [address], [place], [place] [address]  CBC with Differential     Status: Abnormal   Collection Time: 02/24/20  1:44 AM  Result Value Ref Range   WBC 11.7 (H) 4.0 - 10.5 K/uL   RBC 4.07 3.87 - 5.11 MIL/uL  Hemoglobin 11.4 (L) 12.0 - 15.0 g/dL   HCT 33.8 (L) 36 - 46 %   MCV 83.0 80.0 - 100.0 fL   MCH 28.0 26.0 - 34.0 pg   MCHC 33.7 30.0 - 36.0 g/dL   RDW 12.4 11.5 - 15.5 %   Platelets 120 (L) 150 - 400 K/uL   nRBC 0.0 0.0 - 0.2 %   Neutrophils Relative % 83 %   Neutro Abs 9.7 (H) 1.7 - 7.7 K/uL   Lymphocytes Relative 9 %   Lymphs Abs 1.1 0.7 - 4.0 K/uL   Monocytes Relative 7 %   Monocytes Absolute 0.8 0 - 1 K/uL   Eosinophils Relative 0 %   Eosinophils Absolute 0.0 0 - 0 K/uL   Basophils Relative 0 %   Basophils Absolute 0.0 0 - 0 K/uL   Immature Granulocytes 1 %   Abs Immature Granulocytes 0.06 0.00 - 0.07 K/uL    Comment: Performed at [facility] [address] [place], [place] [address]  I-Stat beta hCG blood, ED ([facility], [facility], [facility] only)     Status: None   Collection Time: 02/24/20  1:50 AM  Result Value Ref Range   I-stat hCG, quantitative <5.0 <5 mIU/mL   Comment 3            Comment:   GEST. AGE       CONC.  (mIU/mL)   <=1 WEEK        5 - 50     2 WEEKS       50 - 500     3 WEEKS       100 - 10,000     4 WEEKS     1,000 - 30,000        FEMALE AND NON-PREGNANT FEMALE:     LESS THAN 5 mIU/mL   Urinalysis, Routine w reflex microscopic Urine, Clean Catch     Status: Abnormal   Collection Time: 02/24/20  4:53 AM  Result Value Ref Range   Color, Urine AMBER (A) YELLOW    Comment: BIOCHEMICALS MAY BE AFFECTED BY COLOR   APPearance CLOUDY (A) CLEAR   Specific Gravity, Urine 1.030 1.005 - 1.030   pH 5.0 5.0 - 8.0   Glucose, UA NEGATIVE NEGATIVE mg/dL   Hgb urine dipstick SMALL (A) NEGATIVE   Bilirubin Urine NEGATIVE NEGATIVE   Ketones, ur NEGATIVE NEGATIVE mg/dL   Protein, ur 30 (A) NEGATIVE mg/dL   Nitrite POSITIVE (A) NEGATIVE   Leukocytes,Ua MODERATE (A) NEGATIVE   RBC / HPF 6-10 0 - 5 RBC/hpf   WBC, UA >50 (H) 0 - 5 WBC/hpf   Bacteria, UA MANY (A) NONE SEEN   Squamous Epithelial / LPF 6-10 0 - 5   Mucus PRESENT     Comment: Performed at [facility], [address], [place], [place] [address]  SARS Coronavirus 2 by RT PCR (hospital order, performed in [facility] hospital lab) Nasopharyngeal Nasopharyngeal Swab     Status: Abnormal   Collection Time: 02/24/20  6:44 AM   Specimen: Nasopharyngeal Swab  Result Value Ref Range   SARS Coronavirus 2 POSITIVE (A) NEGATIVE    Comment: emailed L. Berdik RN 9:40 02/24/20 ([email or web address]) (NOTE) SARS-CoV-2 target nucleic acids are DETECTED  SARS-CoV-2 RNA is generally detectable in upper respiratory specimens  during the acute phase of infection.  Positive results are indicative  of the presence of the identified virus, but do not rule out bacterial infection or co-infection  with other pathogens not detected by the test.  Clinical correlation with patient history and  other diagnostic information is necessary to determine patient infection status.  The expected result is negative.  Fact Sheet for Patients:    StrictlyIdeas.no   Fact Sheet for Healthcare Providers:   BankingDealers.co.za    This test is not yet approved or cleared by the [place] FDA and  has been authorized for detection and/or diagnosis of SARS-CoV-2 by FDA under an Emergency Use Authorization (EUA).  This EUA will remain in effect (meaning this test can be used) for the duration of  the  COVID-19 declaration under Section 564(b)(1) of the Act, 21 U.S.C. section 360-bbb-3(b)(1), unless the authorization is terminated or revoked sooner.  Performed at [facility], [place] [address], [place], [place] [address]     DG Hand Complete Left  Result Date: 02/24/2020 CLINICAL DATA:  Laceration to the left ring finger. Diffuse and swelling. EXAM: LEFT HAND - COMPLETE 3+ VIEW COMPARISON:  None. FINDINGS: Diffuse soft tissue swelling is present over the dorsum of the hand. Soft swelling is noted in the left ring finger. No focal foreign body is present. No focal or acute osseous abnormalities are present. Joints are located and within normal limits. IMPRESSION: 1. Diffuse soft tissue swelling over the dorsum of the hand and left ring finger without underlying osseous abnormality. 2. No focal foreign body. Electronically Signed   By: San Morelle M.D.   On: 02/24/2020 09:21    Review of Systems  Constitutional: Positive for chills and fever. Negative for diaphoresis.  HENT: Negative for ear discharge, ear pain, hearing loss and tinnitus.   Eyes: Negative for photophobia and pain.  Respiratory: Negative for cough and shortness of breath.   Cardiovascular: Negative for chest pain.  Gastrointestinal: Positive for nausea. Negative for abdominal pain and vomiting.  Genitourinary: Negative for dysuria, flank pain, frequency and urgency.  Musculoskeletal: Positive for arthralgias (Left hand). Negative for back pain, myalgias and neck pain.  Neurological: Negative for dizziness  and headaches.  Hematological: Does not bruise/bleed easily.  Psychiatric/Behavioral: The patient is not nervous/anxious.    Blood pressure 113/65, pulse 75, temperature 98.4 F (36.9 C), resp. rate (!) 22, SpO2 100 %. Physical Exam Constitutional:      General: She is not in acute distress.    Appearance: She is well-developed. She is not diaphoretic.  HENT:     Head: Normocephalic and atraumatic.  Eyes:     General: No scleral icterus.       Right eye: No discharge.        Left eye: No discharge.     Conjunctiva/sclera: Conjunctivae normal.  Cardiovascular:     Rate and Rhythm: Normal rate and regular rhythm.  Pulmonary:     Effort: Pulmonary effort is normal. No respiratory distress.  Musculoskeletal:     Cervical back: Normal range of motion.     Comments: Left shoulder, elbow, wrist, digits- Healed laceration radial aspect PIP joint ring finger, mild fusiform edema P1/P2, finger held slightly flexed, mod palmar TTP, severe pain with passive extension, no instability, no blocks to motion  Sens  Ax/R/M/U intact  Mot   Ax/ R/ PIN/ M/ AIN/ U intact  Rad 2+  Skin:    General: Skin is warm and dry.  Neurological:     Mental Status: She is alert.  Psychiatric:        Behavior: Behavior normal.     Assessment/Plan: Left hand infection --  I suspect she has flexor tenosynovitis but will get MRI to confirm. If so will need to go for I&D by Dr. Amedeo Plenty this evening. Please continue NPO. Covid infection UTI -- Have asked medicine to admit and manage    Lisette Abu, PA-C Orthopedic Surgery [phone] 02/24/2020, 9:45 AM

## 2020-02-24 NOTE — ED Provider Notes (Signed)
44 yo female presenting with laceration to fourth ring finger from 2 days ago, here with concern for possible flexor tenosynovitis.  Now s/p Ancef.  Pending hand surgery consult.  Also being swabbed for Covid as she has had loss of smell and is not vaccinated for covid.    0800 - per hand surgeon, will admit patient to medical service, hand surgery will come to evaluate patient, possible OR later this evening or tomorrow.  Keflex ordered for UTI and continued cellulitis coverage.  Dr Roxanne Mins admitted the patient to medical service.   Wyvonnia Dusky, MD 02/24/20 [phone]

## 2020-02-24 NOTE — Progress Notes (Signed)
Patient extremely claustrophobic. Got patient into scanner, but she immediately started screaming and crying. Took mask off and stated that she cant do it. Patient sent back to ED

## 2020-02-24 NOTE — Anesthesia Preprocedure Evaluation (Addendum)
Anesthesia Evaluation  Patient identified by MRN, date of birth, ID band Patient awake    Reviewed: Allergy & Precautions, NPO status , Patient's Chart, lab work & pertinent test results  Airway Mallampati: II  TM Distance: >3 FB Neck ROM: Full    Dental  (+) Dental Advisory Given   Pulmonary Current Smoker and Patient abstained from smoking.,  COVID +    breath sounds clear to auscultation       Cardiovascular negative cardio ROS   Rhythm:Regular Rate:Normal     Neuro/Psych negative neurological ROS     GI/Hepatic negative GI ROS, (+)     substance abuse  alcohol use,   Endo/Other  negative endocrine ROS  Renal/GU negative Renal ROS  Female GU complaint     Musculoskeletal negative musculoskeletal ROS (+)   Abdominal   Peds  Hematology  (+) Blood dyscrasia, anemia , hct 33.8, plt 120   Anesthesia Other Findings Left hand infection   Reproductive/Obstetrics                            Anesthesia Physical Anesthesia Plan  ASA: II and emergent  Anesthesia Plan: General   Post-op Pain Management:    Induction: Intravenous and Rapid sequence  PONV Risk Score and Plan: 2 and Dexamethasone, Ondansetron and Treatment may vary due to age or medical condition  Airway Management Planned: Oral ETT  Additional Equipment: None  Intra-op Plan:   Post-operative Plan: Extubation in OR  Informed Consent: I have reviewed the patients History and Physical, chart, labs and discussed the procedure including the risks, benefits and alternatives for the proposed anesthesia with the patient or authorized representative who has indicated his/her understanding and acceptance.     Dental advisory given  Plan Discussed with: CRNA  Anesthesia Plan Comments:         Anesthesia Quick Evaluation

## 2020-02-24 NOTE — ED Provider Notes (Signed)
[facility] EMERGENCY DEPARTMENT Provider Note   CSN: [id number] Arrival date & time: 02/24/20  0115   History Chief Complaint  Patient presents with  . Hand Pain    Danielle Nolan is a 44 y.o. female.  The history is provided by the patient.  Hand Pain  She has no significant past history, and comes in because of pain and swelling of her left fourth finger.  She cut her finger on a can lid 3 days ago.  She tried to wash it out, but it has been getting progressively more swollen and painful since then.  She thinks he may have been running low-grade fevers which she ascribes to a cold she has had.  She states multiple family members have had colds.  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  She has not been vaccinated against COVID-19.  Past Medical History:  Diagnosis Date  . Fibroid   . Ovarian cyst     Patient Active Problem List   Diagnosis Date Noted  . Acute cholecystitis 12/10/2017  . Other and unspecified ovarian cyst 01/27/2013    Past Surgical History:  Procedure Laterality Date  . BTL    . CHOLECYSTECTOMY N/A 12/11/2017   Procedure: LAPAROSCOPIC CHOLECYSTECTOMY WITH INTRAOPERATIVE CHOLANGIOGRAM;  Surgeon: Donnie Mesa, MD;  Location: [facility];  Service: General;  Laterality: N/A;  . Cryo Therapy     done at age 68.  Dx with cervical cancer     OB History    Gravida  4   Para  3   Term  3   Preterm      AB  1   Living  3     SAB  1   TAB      Ectopic      Multiple      Live Births              Family History  Problem Relation Age of Onset  . Hypertension Maternal Grandmother   . Heart disease Maternal Grandmother   . Cancer Maternal Grandmother   . Hypertension Maternal Grandfather   . Diabetes Maternal Grandfather   . Cancer Maternal Grandfather   . Hypertension Paternal Grandmother   . Hypertension Paternal Grandfather     Social History   Tobacco Use  . Smoking status: Current Every Day Smoker     Packs/day: 1.00    Types: Cigarettes  . Smokeless tobacco: Never Used  Substance Use Topics  . Alcohol use: Yes    Comment: occasionally  . Drug use: No    Home Medications Prior to Admission medications   Not on File    Allergies    Patient has no known allergies.  Review of Systems   Review of Systems  All other systems reviewed and are negative.   Physical Exam Updated Vital Signs BP 113/65 (BP Location: Left Arm)   Pulse 75   Temp 98.4 F (36.9 C)   Resp (!) 22   SpO2 100%   Physical Exam Vitals and nursing note reviewed.   44 year old female, resting comfortably and in no acute distress. Vital signs are significant for mildly elevated respiratory rate. Oxygen saturation is 100%, which is normal. Head is normocephalic and atraumatic. PERRLA, EOMI. Oropharynx is clear. Neck is nontender and supple without adenopathy or JVD. Back is nontender and there is no CVA tenderness. Lungs are clear without rales, wheezes, or rhonchi. Chest is nontender. Heart has regular rate  and rhythm without murmur. Abdomen is soft, flat, nontender without masses or hepatosplenomegaly and peristalsis is normoactive. Extremities: There is erythema and moderate swelling of the left fourth finger with erythema and swelling extending into the dorsum of the right hand and also slightly into the third finger.  There is marked pain with any movement.  There is no definite fluctuance. Skin is warm and dry without rash. Neurologic: Mental status is normal, cranial nerves are intact, there are no motor or sensory deficits.   ED Results / Procedures / Treatments   Labs (all labs ordered are listed, but only abnormal results are displayed) Labs Reviewed  COMPREHENSIVE METABOLIC PANEL - Abnormal; Notable for the following components:      Result Value   Potassium 3.3 (*)    Glucose, Bld 116 (*)    Calcium 8.6 (*)    Total Protein 5.9 (*)    Albumin 3.1 (*)    Total Bilirubin 0.2 (*)     All other components within normal limits  CBC WITH DIFFERENTIAL/PLATELET - Abnormal; Notable for the following components:   WBC 11.7 (*)    Hemoglobin 11.4 (*)    HCT 33.8 (*)    Platelets 120 (*)    Neutro Abs 9.7 (*)    All other components within normal limits  URINALYSIS, ROUTINE W REFLEX MICROSCOPIC - Abnormal; Notable for the following components:   Color, Urine AMBER (*)    APPearance CLOUDY (*)    Hgb urine dipstick SMALL (*)    Protein, ur 30 (*)    Nitrite POSITIVE (*)    Leukocytes,Ua MODERATE (*)    WBC, UA >50 (*)    Bacteria, UA MANY (*)    All other components within normal limits  SARS CORONAVIRUS 2 BY RT PCR (HOSPITAL ORDER, [facility])  LACTIC ACID, PLASMA  LACTIC ACID, PLASMA  I-STAT BETA HCG BLOOD, ED ([facility], [facility], [facility] ONLY)   Procedures Procedures  Medications Ordered in ED Medications  ceFAZolin (ANCEF) IVPB 2g/100 mL premix (2 g Intravenous New Bag/Given 02/24/20 0705)  cephALEXin (KEFLEX) capsule 500 mg (has no administration in time range)  potassium chloride SA (KLOR-CON) CR tablet 40 mEq (has no administration in time range)    ED Course  I have reviewed the triage vital signs and the nursing notes.  Pertinent lab results that were available during my care of the patient were reviewed by me and considered in my medical decision making (see chart for details).  MDM Rules/Calculators/A&P Cellulitis of the left fourth finger with degree of pain and swelling creating concern for possible tenosynovitis.  Also, symptoms suspicious for COVID-19.  COVID-19 swab is sent and she is started on IV antibiotics.  Labs show mild leukocytosis with slight left shift.  Mild hypokalemia and she is given a dose of oral potassium.  Incidental finding of urinary tract infection.  Urine culture is sent.  Urinary tract infection should be covered adequately by antibiotics for cellulitis.  Case is discussed with Dr. Amedeo Plenty who requests she be admitted to  hospitalist service and he will evaluate for possible surgical management.  Danielle Nolan was evaluated in Emergency Department on 02/24/2020 for the symptoms described in the history of present illness. She was evaluated in the context of the global COVID-19 pandemic, which necessitated consideration that the patient might be at risk for infection with the SARS-CoV-2 virus that causes COVID-19. Institutional protocols and algorithms that pertain to the evaluation of patients at risk for  COVID-19 are in a state of rapid change based on information released by regulatory bodies including the CDC and federal and state organizations. These policies and algorithms were followed during the patient's care in the ED.  Final Clinical Impression(s) / ED Diagnoses Final diagnoses:  Cellulitis of finger of left hand  Hypokalemia  Suspected COVID-19 virus infection  Urinary tract infection without hematuria, site unspecified    Rx / DC Orders ED Discharge Orders    None       Delora Fuel, MD 71/82/09 [phone]

## 2020-02-25 ENCOUNTER — Encounter (HOSPITAL_COMMUNITY): Payer: Self-pay | Admitting: Orthopedic Surgery

## 2020-02-25 LAB — C-REACTIVE PROTEIN: CRP: 6.8 mg/dL — ABNORMAL HIGH (ref <1.0)

## 2020-02-25 LAB — COMPREHENSIVE METABOLIC PANEL
ALT: 56 U/L — ABNORMAL HIGH (ref 0–44)
AST: 96 U/L — ABNORMAL HIGH (ref 15–41)
Albumin: 2.5 g/dL — ABNORMAL LOW (ref 3.5–5.0)
Alkaline Phosphatase: 107 U/L (ref 38–126)
Anion gap: 8 (ref 5–15)
BUN: <5 mg/dL — ABNORMAL LOW (ref 6–20)
CO2: 25 mmol/L (ref 22–32)
Calcium: 8.1 mg/dL — ABNORMAL LOW (ref 8.9–10.3)
Chloride: 104 mmol/L (ref 98–111)
Creatinine, Ser: 0.53 mg/dL (ref 0.44–1.00)
GFR calc Af Amer: >60 mL/min (ref >60)
GFR calc non Af Amer: >60 mL/min (ref >60)
Glucose, Bld: 84 mg/dL (ref 70–99)
Potassium: 3.4 mmol/L — ABNORMAL LOW (ref 3.5–5.1)
Sodium: 137 mmol/L (ref 135–145)
Total Bilirubin: 0.3 mg/dL (ref 0.3–1.2)
Total Protein: 5.1 g/dL — ABNORMAL LOW (ref 6.5–8.1)

## 2020-02-25 LAB — CBC WITH DIFFERENTIAL/PLATELET
Abs Immature Granulocytes: 0.02 10*3/uL (ref 0.00–0.07)
Basophils Absolute: 0 10*3/uL (ref 0.0–0.1)
Basophils Relative: 0 %
Eosinophils Absolute: 0 10*3/uL (ref 0.0–0.5)
Eosinophils Relative: 1 %
HCT: 32.2 % — ABNORMAL LOW (ref 36.0–46.0)
Hemoglobin: 11 g/dL — ABNORMAL LOW (ref 12.0–15.0)
Immature Granulocytes: 0 %
Lymphocytes Relative: 25 %
Lymphs Abs: 1.4 10*3/uL (ref 0.7–4.0)
MCH: 28.6 pg (ref 26.0–34.0)
MCHC: 34.2 g/dL (ref 30.0–36.0)
MCV: 83.9 fL (ref 80.0–100.0)
Monocytes Absolute: 0.3 10*3/uL (ref 0.1–1.0)
Monocytes Relative: 5 %
Neutro Abs: 4 10*3/uL (ref 1.7–7.7)
Neutrophils Relative %: 69 %
Platelets: 103 10*3/uL — ABNORMAL LOW (ref 150–400)
RBC: 3.84 MIL/uL — ABNORMAL LOW (ref 3.87–5.11)
RDW: 12.9 % (ref 11.5–15.5)
WBC: 5.7 10*3/uL (ref 4.0–10.5)
nRBC: 0 % (ref 0.0–0.2)

## 2020-02-25 LAB — HIV ANTIBODY (ROUTINE TESTING W REFLEX): HIV Screen 4th Generation wRfx: NONREACTIVE

## 2020-02-25 LAB — FERRITIN: Ferritin: 77 ng/mL (ref 11–307)

## 2020-02-25 MED ORDER — MUPIROCIN 2 % EX OINT
1.0000 "application " | TOPICAL_OINTMENT | Freq: Two times a day (BID) | CUTANEOUS | Status: DC
  Administered 2020-02-25 – 2020-02-28 (×6): 1 via NASAL
  Filled 2020-02-25 (×3): qty 22

## 2020-02-25 MED ORDER — CHLORHEXIDINE GLUCONATE CLOTH 2 % EX PADS
6.0000 | MEDICATED_PAD | Freq: Every day | CUTANEOUS | Status: DC
  Administered 2020-02-25 – 2020-02-28 (×4): 6 via TOPICAL

## 2020-02-25 MED ORDER — VANCOMYCIN HCL IN DEXTROSE 1-5 GM/200ML-% IV SOLN
1000.0000 mg | Freq: Two times a day (BID) | INTRAVENOUS | Status: AC
  Administered 2020-02-25 – 2020-02-27 (×6): 1000 mg via INTRAVENOUS
  Filled 2020-02-25 (×6): qty 200

## 2020-02-25 NOTE — Progress Notes (Signed)
Patient ID: Danielle Nolan, female   DOB: [birth year], 44 y.o.   MRN: [id number] Patient seen at bedside.  Patient is alert and oriented.  I went over the diagnosis plans and concerns.  Initial cultures show gram-positive cocci.  Await final cultures.  We will plan for return trip to the operative theater with partial wound closure tomorrow and removal of her drain.  Hopefully conditions will improve.  As this did not penetrate her bone I am optimistic that she can have a very reasonable recovery however time will tell in terms of her functional capabilities etc.  We will move forward with surgical irrigation debridement tomorrow as outlined.  We will tailor antibiotics once cultures are final.  Marquitta Persichetti MD

## 2020-02-25 NOTE — Progress Notes (Signed)
Pt arrived back from PACU at 00:13. Pt is  drowsy however, answers to voice. Oriented x4. VSS. Pt on 8L simple mask.  Dressing to L. Hand clean, dry and intact. No complaints of pain at this time.

## 2020-02-25 NOTE — Progress Notes (Signed)
° °  Subjective:   She is feeling well this morning. Starting to have some pain in her hand. She denies shortness of breath but does have mild cough. Discussed that she has pain medication and cough medicine available that she can request.  She denies abdominal pain or nausea or diarrhea.   Objective:  Vital signs in last 24 hours: Vitals:   02/25/20 0200 02/25/20 0400 02/25/20 0600 02/25/20 0810  BP:    107/69  Pulse:    (!) 52  Resp:    18  Temp:    98.5 F (36.9 C)  TempSrc:      SpO2: 100% 100% 100% 94%  Weight:       Supplemental Oxygen: 94% on RA  Constitution: NAD, appears stated age Cardio: RRR, no m/r/g, no LE edema  Respiratory: CTA, no w/r/r Abdominal: NTTP, soft, non-distended MSK: moving all extremities, sensation intact distal fingers left hand, left arm elevated Neuro: normal affect, a&ox3 Skin: c/d/i, dressings in place covering left hand   Assessment/Plan:  Active Problems:   Cellulitis and abscess of hand  44yo female with no known PMH presenting with left hand cellulitis after cutting her hand on a can lid and mildly symptomatic covid-19.   [facility] day 2. No oxygen requirement. CRP 6.8, other inflammatory markers normal and this is likely partially related to the abscess of her right hand. Transaminitis present likely secondary to covid-19.   - regen-cov MAb infusion - robitussen q4h prn - IS, flutter valve - will need covid vaccine after discharge - trend LFTs, consider [place] if unchanged tomorrow  - TOC consult to establish with PCP   Left Hand Cellulitis with abscess and tenosynovitis Unable to complete MRI yesterday due to anxiety, she was taken to the OR for exploration with tenosynovitis and abscess found along the 4th finger and superficial debridement of the thumb. Flaps were left open with plans for later closure.   - f/u orthopedics for plans for return to OR  - vanc added with findings of abscess, continue today, continue  rocephin - f/u wound cultures  - f/u blood culture   - cont. Dilaudid .5mg  q4h prn - last tetanus shot 04/2019 - OT    Bacteruria  Asymptomatic, covered with ceftriaxone. F/u urine cultures   VTE: lovenox IVF: none Diet: regular Code: full  Dispo: Anticipated discharge pending medical work-up and surgical treatment.   Koua Deeg A, DO 02/25/2020, 8:27 AM Pager: [phone] After 5pm on weekdays and 1pm on weekends: On Call Pager: [phone]

## 2020-02-25 NOTE — Anesthesia Postprocedure Evaluation (Signed)
Anesthesia Post Note  Patient: Danielle Nolan  Procedure(s) Performed: IRRIGATION AND DEBRIDEMENT LEFT HAND RING FINGER (Left Hand)     Patient location during evaluation: PACU Anesthesia Type: General Level of consciousness: awake and alert Pain management: pain level controlled Vital Signs Assessment: post-procedure vital signs reviewed and stable Respiratory status: spontaneous breathing, nonlabored ventilation, respiratory function stable and patient connected to nasal cannula oxygen Cardiovascular status: blood pressure returned to baseline and stable Postop Assessment: no apparent nausea or vomiting Anesthetic complications: no   No complications documented.  Last Vitals:  Vitals:   02/25/20 0200 02/25/20 0400  BP:    Pulse:    Resp:    Temp:    SpO2: 100% 100%    Last Pain:  Vitals:   02/25/20 0022  TempSrc: Oral  PainSc: Asleep                 Tiajuana Amass

## 2020-02-25 NOTE — Progress Notes (Signed)
Patient still refusing MRI.

## 2020-02-25 NOTE — Progress Notes (Addendum)
02/25/2020 MRI Call to see if patient will come down for MRI of her left hand. Patient said she is not going in that tube. Patient was asked by RN if she is claustrophobic and she said yes. Rn try to explain to patient that she can get something to relax her before going to MRI, but she insist she is not doing the MRI, Adele Barthel.

## 2020-02-26 ENCOUNTER — Inpatient Hospital Stay (HOSPITAL_COMMUNITY): Payer: Self-pay | Admitting: Certified Registered Nurse Anesthetist

## 2020-02-26 ENCOUNTER — Encounter (HOSPITAL_COMMUNITY)
Admission: EM | Discharge status: Against Medical Advice | Payer: Self-pay | Source: Home / Self Care | Attending: Internal Medicine

## 2020-02-26 ENCOUNTER — Encounter (HOSPITAL_COMMUNITY): Payer: Self-pay | Admitting: Internal Medicine

## 2020-02-26 HISTORY — PX: I & D EXTREMITY: SHX5045

## 2020-02-26 LAB — URINE CULTURE: Culture: >=100000 — AB

## 2020-02-26 SURGERY — IRRIGATION AND DEBRIDEMENT EXTREMITY
Anesthesia: General | Laterality: Left

## 2020-02-26 MED ORDER — ONDANSETRON HCL 4 MG/2ML IJ SOLN
INTRAMUSCULAR | Status: AC
  Filled 2020-02-26: qty 2

## 2020-02-26 MED ORDER — OXYCODONE HCL 5 MG PO TABS
10.0000 mg | ORAL_TABLET | ORAL | Status: DC | PRN
  Administered 2020-02-26 – 2020-02-28 (×5): 10 mg via ORAL
  Filled 2020-02-26 (×5): qty 2

## 2020-02-26 MED ORDER — PROPOFOL 10 MG/ML IV BOLUS
INTRAVENOUS | Status: DC | PRN
  Administered 2020-02-26: 150 mg via INTRAVENOUS

## 2020-02-26 MED ORDER — DEXAMETHASONE SODIUM PHOSPHATE 10 MG/ML IJ SOLN
INTRAMUSCULAR | Status: AC
  Filled 2020-02-26: qty 1

## 2020-02-26 MED ORDER — METHOCARBAMOL 500 MG PO TABS: 500.0000 mg | ORAL_TABLET | Freq: Four times a day (QID) | ORAL | Status: DC | PRN

## 2020-02-26 MED ORDER — SUCCINYLCHOLINE CHLORIDE 200 MG/10ML IV SOSY
PREFILLED_SYRINGE | INTRAVENOUS | Status: AC
  Filled 2020-02-26: qty 10

## 2020-02-26 MED ORDER — ONDANSETRON HCL 4 MG/2ML IJ SOLN
INTRAMUSCULAR | Status: DC | PRN
  Administered 2020-02-26: 4 mg via INTRAVENOUS

## 2020-02-26 MED ORDER — DEXAMETHASONE SODIUM PHOSPHATE 10 MG/ML IJ SOLN
INTRAMUSCULAR | Status: DC | PRN
  Administered 2020-02-26: 10 mg via INTRAVENOUS

## 2020-02-26 MED ORDER — FENTANYL CITRATE (PF) 250 MCG/5ML IJ SOLN
INTRAMUSCULAR | Status: AC
  Filled 2020-02-26: qty 5

## 2020-02-26 MED ORDER — LIDOCAINE 2% (20 MG/ML) 5 ML SYRINGE
INTRAMUSCULAR | Status: DC | PRN
  Administered 2020-02-26: 60 mg via INTRAVENOUS

## 2020-02-26 MED ORDER — LACTATED RINGERS IV SOLN: INTRAVENOUS | Status: DC | PRN

## 2020-02-26 MED ORDER — SUCCINYLCHOLINE CHLORIDE 200 MG/10ML IV SOSY
PREFILLED_SYRINGE | INTRAVENOUS | Status: DC | PRN
  Administered 2020-02-26: 100 mg via INTRAVENOUS

## 2020-02-26 MED ORDER — HYDROCODONE-ACETAMINOPHEN 7.5-325 MG PO TABS: 1.0000 | ORAL_TABLET | Freq: Four times a day (QID) | ORAL | Status: DC | PRN

## 2020-02-26 MED ORDER — FENTANYL CITRATE (PF) 250 MCG/5ML IJ SOLN
INTRAMUSCULAR | Status: DC | PRN
Start: 2020-02-26 — End: 2020-02-26
  Administered 2020-02-26: 100 ug via INTRAVENOUS
  Administered 2020-02-26: 50 ug via INTRAVENOUS

## 2020-02-26 MED ORDER — ASCORBIC ACID 500 MG PO TABS
1000.0000 mg | ORAL_TABLET | Freq: Every day | ORAL | Status: DC
  Administered 2020-02-26 – 2020-02-28 (×3): 1000 mg via ORAL
  Filled 2020-02-26 (×3): qty 2

## 2020-02-26 MED ORDER — LIDOCAINE 2% (20 MG/ML) 5 ML SYRINGE
INTRAMUSCULAR | Status: AC
  Filled 2020-02-26: qty 5

## 2020-02-26 MED ORDER — HYDROMORPHONE HCL 1 MG/ML IJ SOLN
0.5000 mg | INTRAMUSCULAR | Status: DC | PRN
  Administered 2020-02-27 – 2020-02-28 (×3): 0.5 mg via INTRAVENOUS
  Filled 2020-02-26 (×5): qty 1

## 2020-02-26 MED ORDER — MIDAZOLAM HCL 2 MG/2ML IJ SOLN
INTRAMUSCULAR | Status: DC | PRN
  Administered 2020-02-26: 2 mg via INTRAVENOUS

## 2020-02-26 MED ORDER — SENNOSIDES-DOCUSATE SODIUM 8.6-50 MG PO TABS
1.0000 | ORAL_TABLET | Freq: Two times a day (BID) | ORAL | Status: DC
  Administered 2020-02-27 – 2020-02-28 (×2): 1 via ORAL
  Filled 2020-02-26 (×3): qty 1

## 2020-02-26 MED ORDER — HYDROMORPHONE HCL 1 MG/ML IJ SOLN
0.5000 mg | Freq: Once | INTRAMUSCULAR | Status: AC
  Administered 2020-02-26: 0.5 mg via INTRAVENOUS
  Filled 2020-02-26: qty 1

## 2020-02-26 MED ORDER — PROPOFOL 10 MG/ML IV BOLUS
INTRAVENOUS | Status: AC
  Filled 2020-02-26: qty 40

## 2020-02-26 MED ORDER — SODIUM CHLORIDE 0.9 % IR SOLN
Status: DC | PRN
  Administered 2020-02-26: 3000 mL

## 2020-02-26 MED ORDER — POTASSIUM CHLORIDE CRYS ER 20 MEQ PO TBCR
40.0000 meq | EXTENDED_RELEASE_TABLET | Freq: Once | ORAL | Status: AC
  Administered 2020-02-26: 40 meq via ORAL
  Filled 2020-02-26: qty 2

## 2020-02-26 MED ORDER — ALPRAZOLAM 0.5 MG PO TABS: 0.5000 mg | ORAL_TABLET | Freq: Four times a day (QID) | ORAL | Status: DC | PRN

## 2020-02-26 MED ORDER — MIDAZOLAM HCL 2 MG/2ML IJ SOLN
INTRAMUSCULAR | Status: AC
  Filled 2020-02-26: qty 2

## 2020-02-26 SURGICAL SUPPLY — 39 items
BNDG CONFORM 2 STRL LF (GAUZE/BANDAGES/DRESSINGS) ×2 IMPLANT
BNDG ELASTIC 3X5.8 VLCR STR LF (GAUZE/BANDAGES/DRESSINGS) ×2 IMPLANT
BNDG ELASTIC 4X5.8 VLCR STR LF (GAUZE/BANDAGES/DRESSINGS) ×2 IMPLANT
BNDG GAUZE ELAST 4 BULKY (GAUZE/BANDAGES/DRESSINGS) ×5 IMPLANT
CORD BIPOLAR FORCEPS 12FT (ELECTRODE) ×2 IMPLANT
COVER SURGICAL LIGHT HANDLE (MISCELLANEOUS) ×2 IMPLANT
CUFF TOURN SGL QUICK 18X4 (TOURNIQUET CUFF) ×2 IMPLANT
DRSG ADAPTIC 3X8 NADH LF (GAUZE/BANDAGES/DRESSINGS) ×2 IMPLANT
GAUZE SPONGE 4X4 12PLY STRL (GAUZE/BANDAGES/DRESSINGS) ×2 IMPLANT
GAUZE SPONGE 4X4 12PLY STRL LF (GAUZE/BANDAGES/DRESSINGS) ×1 IMPLANT
GAUZE XEROFORM 1X8 LF (GAUZE/BANDAGES/DRESSINGS) ×2 IMPLANT
GAUZE XEROFORM 5X9 LF (GAUZE/BANDAGES/DRESSINGS) ×1 IMPLANT
GLOVE SS BIOGEL STRL SZ 8 (GLOVE) ×1 IMPLANT
GLOVE SUPERSENSE BIOGEL SZ 8 (GLOVE) ×1
GOWN STRL REUS W/ TWL LRG LVL3 (GOWN DISPOSABLE) ×1 IMPLANT
GOWN STRL REUS W/ TWL XL LVL3 (GOWN DISPOSABLE) ×2 IMPLANT
GOWN STRL REUS W/TWL LRG LVL3 (GOWN DISPOSABLE) ×2
GOWN STRL REUS W/TWL XL LVL3 (GOWN DISPOSABLE) ×4
KIT BASIN OR (CUSTOM PROCEDURE TRAY) ×2 IMPLANT
KIT TURNOVER KIT B (KITS) ×2 IMPLANT
LOOP VESSEL MAXI BLUE (MISCELLANEOUS) ×1 IMPLANT
MANIFOLD NEPTUNE II (INSTRUMENTS) ×2 IMPLANT
NDL HYPO 25GX1X1/2 BEV (NEEDLE) IMPLANT
NEEDLE HYPO 25GX1X1/2 BEV (NEEDLE) ×2 IMPLANT
NS IRRIG 1000ML POUR BTL (IV SOLUTION) ×2 IMPLANT
PACK ORTHO EXTREMITY (CUSTOM PROCEDURE TRAY) ×2 IMPLANT
PAD ARMBOARD 7.5X6 YLW CONV (MISCELLANEOUS) ×2 IMPLANT
PAD CAST 4YDX4 CTTN HI CHSV (CAST SUPPLIES) ×1 IMPLANT
PADDING CAST COTTON 4X4 STRL (CAST SUPPLIES) ×2
SET CYSTO W/LG BORE CLAMP LF (SET/KITS/TRAYS/PACK) ×2 IMPLANT
SOL PREP POV-IOD 4OZ 10% (MISCELLANEOUS) ×4 IMPLANT
SUT CHROMIC 4 0 P 3 18 (SUTURE) ×1 IMPLANT
SUT PROLENE 4 0 PS 2 18 (SUTURE) ×1 IMPLANT
SYR BULB IRRIG 60ML STRL (SYRINGE) ×1 IMPLANT
TOWEL GREEN STERILE (TOWEL DISPOSABLE) ×2 IMPLANT
TOWEL GREEN STERILE FF (TOWEL DISPOSABLE) ×2 IMPLANT
TUBE CONNECTING 12X1/4 (SUCTIONS) ×2 IMPLANT
WATER STERILE IRR 1000ML POUR (IV SOLUTION) ×2 IMPLANT
YANKAUER SUCT BULB TIP NO VENT (SUCTIONS) ×2 IMPLANT

## 2020-02-26 NOTE — Anesthesia Preprocedure Evaluation (Signed)
Anesthesia Evaluation  Patient identified by MRN, date of birth, ID band Patient awake    Reviewed: Allergy & Precautions, NPO status , Patient's Chart, lab work & pertinent test results  Airway Mallampati: II  TM Distance: >3 FB Neck ROM: Full    Dental  (+) Dental Advisory Given   Pulmonary Current Smoker and Patient abstained from smoking.,  COVID +    breath sounds clear to auscultation       Cardiovascular negative cardio ROS   Rhythm:Regular Rate:Normal     Neuro/Psych negative neurological ROS     GI/Hepatic negative GI ROS, (+)     substance abuse  alcohol use,   Endo/Other  negative endocrine ROS  Renal/GU negative Renal ROS  Female GU complaint     Musculoskeletal negative musculoskeletal ROS (+)   Abdominal   Peds  Hematology  (+) Blood dyscrasia, anemia , hct 33.8, plt 120   Anesthesia Other Findings Left hand infection   Reproductive/Obstetrics                             Anesthesia Physical  Anesthesia Plan  ASA: II  Anesthesia Plan: General   Post-op Pain Management:    Induction: Intravenous and Rapid sequence  PONV Risk Score and Plan: 2 and Dexamethasone, Ondansetron and Treatment may vary due to age or medical condition  Airway Management Planned: Oral ETT  Additional Equipment: None  Intra-op Plan:   Post-operative Plan: Extubation in OR  Informed Consent: I have reviewed the patients History and Physical, chart, labs and discussed the procedure including the risks, benefits and alternatives for the proposed anesthesia with the patient or authorized representative who has indicated his/her understanding and acceptance.     Dental advisory given  Plan Discussed with: CRNA  Anesthesia Plan Comments:         Anesthesia Quick Evaluation

## 2020-02-26 NOTE — Progress Notes (Signed)
OT Cancellation Note  Patient Details Name: Riot Barrick MRN: [id number] DOB: 1976/04/11   Cancelled Treatment:    Reason Eval/Treat Not Completed: Patient at procedure or test/ unavailable (in OR). Will follow up for OT eval as able.  Lou Cal, OT Acute Rehabilitation Services Pager [phone] Office [phone]   Raymondo Band 02/26/2020, 9:09 AM

## 2020-02-26 NOTE — Op Note (Signed)
Operative note 02/26/2020  Roseanne Kaufman MD  Preoperative diagnosis left ring finger extensor infectious tenosynovitis with deep abscess and cellulitic change about the hand and arm.  Postop diagnosis the same  Operative procedure #1 extensor tenolysis tenosynovectomy and radical debridement left ring finger and hand #2 irrigation debridement skin subtenons tissue tendon and peritenon tissue left hand and finger #3 rotation flap coverage left ring finger for coverage of defect secondary to infectious sequelae and loss of tissue  Surgeon Roseanne Kaufman  Anesthesia General  Estimated blood loss minimal  Complications none  Description of procedure: This patient is a 44 year old female who is Covid positive and presented with a massive infection about her left hand and ring finger.  The patient is undergone prior reconstructive efforts in the form of irrigation and debridement with tenolysis as described.  She presents for additional washout and repair.  Patient was seen by myself underwent Covid precautions and a general anesthetic.  She was then prepped with Hibiclens prescrub followed by 10-minute surgical Betadine scrub.  Wound condition showed some epidermal change about the skin owing to breakdown of cellular ball products which goes with her history of infection.  This was not unsurprising.  We prepped and draped the patient timeout was observed and following this I performed irrigation debridement of skin subtenons tissue peritenon and tendon tissue.  Following this performed a radical tenolysis tenosynovectomy of the left ring finger extensor tendon to include dorsal portions of the hand.  This was a radical tenolysis tenosynovectomy to remove any.  Necrotic and necrotic tissue.  Following this we placed 3 L of saline through and through the area followed by bottle of Aricept solution.  Following this I removed any devitalized and prenecrotic tissue and then made an incisions in  extension cuts for rotation flap.  Rotation flap was used to cover the tendon nicely.  I was pleased with soft tissue coverage and felt that now at 48 hours after the initial washout this will likely hold up.  We will continue to await her culture results and finality's regarding directed care of the organism in question.  The wound covered reasonably well over a vessel loop drain although I did close it loosely with a rotation flap coverage.  I was pleased with the attending coverage.  Hopefully she will go on to heal without difficulty.  I would not expect her plan for an additional washout unless her conditions worsen.  Overall conditions were improved today dramatically from her presentation.  All questions have been encouraged and answered.  Saraia Platner MD
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
## 2020-02-26 NOTE — Progress Notes (Signed)
Pt refused Bactroban treatment, RN explained importance of medication. Pt understood that bacteria can potentially spread to her wound if not treated, risk increases with I and D scheduled for today. Pt still refusing.

## 2020-02-26 NOTE — Progress Notes (Signed)
   02/26/20 0604  Assess: MEWS Score  Temp 98.2 F (36.8 C)  BP 94/64  Pulse Rate (!) 54  ECG Heart Rate (!) 47  Resp 14  SpO2 100 %  O2 Device Room Air  Assess: MEWS Score  MEWS Temp 0  MEWS Systolic 1  MEWS Pulse 1  MEWS RR 0  MEWS LOC 0  MEWS Score 2  MEWS Score Color Yellow  Assess: if the MEWS score is Yellow or Red  Were vital signs taken at a resting state? Yes  Focused Assessment No change from prior assessment  Early Detection of Sepsis Score *See Row Information* Low  MEWS guidelines implemented *See Row Information* No, vital signs rechecked (bradycardia not new for pt)  Pt MEWS score yellow, systolic BP in 16W and bradycardia are not new for this pt. No change in mental status, no signs of acute distress, pt denied chest pain, SOB, dizziness. Will continue to monitor pt.

## 2020-02-26 NOTE — Anesthesia Postprocedure Evaluation (Signed)
Anesthesia Post Note  Patient: Kalee Broxton  Procedure(s) Performed: IRRIGATION AND DEBRIDEMENT EXTREMITY (Left )     Patient location during evaluation: PACU Anesthesia Type: General Level of consciousness: awake and alert Pain management: pain level controlled Vital Signs Assessment: post-procedure vital signs reviewed and stable Respiratory status: spontaneous breathing, nonlabored ventilation, respiratory function stable and patient connected to nasal cannula oxygen Cardiovascular status: blood pressure returned to baseline and stable Postop Assessment: no apparent nausea or vomiting Anesthetic complications: no   No complications documented.  Last Vitals:  Vitals:   02/26/20 0920 02/26/20 0935  BP: 113/65 114/68  Pulse: (!) 54 (!) 55  Resp: 18 20  Temp: 36.6 C 36.7 C  SpO2: 100% 100%    Last Pain:  Vitals:   02/26/20 0935  TempSrc:   PainSc: 0-No pain                 Tiajuana Amass

## 2020-02-26 NOTE — Transfer of Care (Signed)
Immediate Anesthesia Transfer of Care Note  Patient: Danielle Nolan  Procedure(s) Performed: IRRIGATION AND DEBRIDEMENT EXTREMITY (Left )  Patient Location:OR 21 Recovery 2* to COVID +  Anesthesia Type:General  Level of Consciousness: patient cooperative and responds to stimulation  Airway & Oxygen Therapy: Patient Spontanous Breathing and Patient connected to face mask oxygen  Post-op Assessment: Report given to RN and Post -op Vital signs reviewed and stable  Post vital signs: Reviewed and stable  Last Vitals:  Vitals Value Taken Time  BP    Temp    Pulse    Resp    SpO2      Last Pain:  Vitals:   02/26/20 0800  TempSrc:   PainSc: 0-No pain      Patients Stated Pain Goal: 3 (59/29/24 4628)  Complications: No complications documented.

## 2020-02-26 NOTE — Progress Notes (Signed)
° °  Subjective:   She just returned from surgery and is feeling hungry. She denies shortness of breath but I still having cough. No nausea or abdominal pain. She had a bowel movement this morning. She is having increased pain in her hand.   Objective:  Vital signs in last 24 hours: Vitals:   02/26/20 0400 02/26/20 0500 02/26/20 0604 02/26/20 0623  BP:   94/64   Pulse:   (!) 54   Resp:   14   Temp:   98.2 F (36.8 C)   TempSrc:   Oral   SpO2: 99% 98% 100% 98%  Weight:       Supplemental Oxygen: 94% on RA  Constitution: NAD, appears stated age Cardio: bradycardic, no m/r/g, no LE edema  Respiratory: CTA, no w/r/r Abdominal: NTTP, soft, non-distended MSK: moving all extremities, sensation intact distal fingers left hand, left arm elevated Neuro: normal affect, a&ox3, pleasant Skin: c/d/i, dressings in place covering left hand   Assessment/Plan:  Active Problems:   Cellulitis and abscess of hand  44yo female with no known PMH presenting with left hand cellulitis after cutting her hand on a can lid and mildly symptomatic covid-19.   [facility] day 3. No oxygen requirement, symptoms mild except for cough.Transaminitis present likely secondary to covid-19, will follow-up am labs. She does not seem to be high risk for progression of her covid so have held off on Mab tranfusion.   - robitussen q4h prn - IS, flutter valve - will need covid vaccine after discharge - trend LFTs, consider [place] if unchanged tomorrow  - TOC consult to establish with PCP   Left Hand Cellulitis with abscess and tenosynovitis Taken to the OR 9/10 for exploration with tenosynovitis and abscess found along the 4th finger and superficial debridement of the thumb. Returned to OR today for second wash out, infection appears to be improving.   - continue vanc and rocephin - f/u wound cultures - gram stain shows few gram positive cocci, culture pendings - 9/10 blood cultures NGTD - cont. Dilaudid .5mg   q4h prn, hydrocodone 7.5 mg q6h prn, extra dose dilaudid now - last tetanus shot 04/2019 - OT    Bacteruria  Asymptomatic, covered with ceftriaxone. Positive for gram negative rods.   VTE: lovenox IVF: none Diet: regular Code: full  Dispo: Anticipated discharge pending medical work-up and surgical treatment.   Alexus Michael A, DO 02/26/2020, 7:43 AM Pager: [phone] After 5pm on weekdays and 1pm on weekends: On Call Pager: [phone]
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
## 2020-02-26 NOTE — Progress Notes (Signed)
Patient ID: Danielle Nolan, female   DOB: 1976-01-14, 44 y.o.   MRN: [id number] Please see surgical note.  Patient underwent her second irrigation and debridement today.  Wound conditions were improved.  Erythema and cellulitic changes have improved as well regarding the upper arm and hand  I will not plan on further washouts but will wait antibiotics and tailor antibiotic treatment based upon the culture and sensitivities.  We will change her bandage Tuesday.  [name] MD

## 2020-02-26 NOTE — Anesthesia Procedure Notes (Signed)
Procedure Name: Intubation Date/Time: 02/26/2020 8:23 AM Performed by: Verdie Drown, CRNA Pre-anesthesia Checklist: Patient identified, Emergency Drugs available, Suction available and Patient being monitored Patient Re-evaluated:Patient Re-evaluated prior to induction Oxygen Delivery Method: Circle System Utilized Preoxygenation: Pre-oxygenation with 100% oxygen Induction Type: IV induction and Rapid sequence Laryngoscope Size: Glidescope, Mac and 3 Tube type: Oral Number of attempts: 1 Airway Equipment and Method: Stylet and Oral airway Placement Confirmation: ETT inserted through vocal cords under direct vision,  positive ETCO2 and breath sounds checked- equal and bilateral Secured at: 22 cm Tube secured with: Tape Dental Injury: Teeth and Oropharynx as per pre-operative assessment  Comments: Elective Glidescope.

## 2020-02-27 ENCOUNTER — Inpatient Hospital Stay (HOSPITAL_COMMUNITY): Payer: Self-pay

## 2020-02-27 ENCOUNTER — Encounter (HOSPITAL_COMMUNITY): Payer: Self-pay | Admitting: Orthopedic Surgery

## 2020-02-27 DIAGNOSIS — R7401 Elevation of levels of liver transaminase levels: Secondary | ICD-10-CM
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
## 2020-02-27 NOTE — Progress Notes (Signed)
Occupational Therapy Evaluation Patient Details Name: Danielle Nolan MRN: [id number] DOB: [birth year] Today's Date: 02/27/2020    History of Present Illness 44yo female with no known past medical history presenting with redness, pain, swelling and tightness of her left hand after cutting it on a can lid . 9/10 Underwent  irrigation debridement deep abscess left ring finger skin subtenons tissue tendon and peritenon tissue were debrided with curette knife and scissor this was an excisional debridement #2 radical tenolysis tenosynovectomy extensor apparatus secondary to infectious tenosynovitis. 9/12  - 2nd I & D L hand.    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 Pt wiggling all digits and keeping LUE elevated in foam block. ROM difficult to assess due to bulky postop bandage. Will plan to return after clarification on ROM and bulkiness of dressing reduced to increase functional ROM and use of L hand. Any further OT needs after DC will be determined as pt follows up with Dr Amedeo Plenty. Will follow acutely.    Follow Up Recommendations  Follow surgeon's recommendation for DC plan and follow-up therapies;Supervision - Intermittent    Equipment Recommendations  None recommended by OT    Recommendations for Other Services       Precautions / Restrictions Precautions Precaution Comments: hand s/p I & D; bulky dressing Restrictions Other Position/Activity Restrictions: kept NWB through hand for comfort      Mobility Bed Mobility Overal bed mobility: Independent                Transfers Overall transfer level: Independent                    Balance Overall balance assessment: No apparent balance deficits (not formally assessed)                                         ADL either performed or assessed with clinical judgement   ADL Overall ADL's : Needs  assistance/impaired                                     Functional mobility during ADLs: Independent General ADL Comments: Pt has "some difficulty opening pkgs" and states "I'm getting it sone". Will benefit form education on compensatory strateiges for ADL tasks     Vision Baseline Vision/History: No visual deficits       Perception     Praxis      Pertinent Vitals/Pain Pain Assessment: 0-10 Pain Score: 6  Pain Location: L hand Pain Descriptors / Indicators: Burning;Discomfort Pain Intervention(s): Limited activity within patient's tolerance;Patient requesting pain meds-RN notified     Hand Dominance Left   Extremity/Trunk Assessment Upper Extremity Assessment Upper Extremity Assessment: LUE deficits/detail LUE Deficits / Details: able to wiggle all fingers; difficult to assess due to bulky post op bandage; elbow/shoulder/wrist AROM WFL LUE: Unable to fully assess due to immobilization   Lower Extremity Assessment Lower Extremity Assessment: Overall WFL for tasks assessed   Cervical / Trunk Assessment Cervical / Trunk Assessment: Normal   Communication Communication Communication: No difficulties   Cognition Arousal/Alertness: Awake/alert Behavior During Therapy: WFL for tasks assessed/performed Overall Cognitive Status: Within Functional Limits for tasks assessed  General Comments       Exercises Exercises: Other exercises Other Exercises Other Exercises: encouraged pt to frequently move fingers Other Exercises: Educated on importance of elevation   Shoulder Instructions      Home Living Family/patient expects to be discharged to:: Private residence Living Arrangements: Spouse/significant other Available Help at Discharge: Family;Available PRN/intermittently Type of Home: House Home Access: Stairs to enter [employer] of Steps: 3-4   Home Layout: One level     Bathroom  Shower/Tub: Teacher, early years/pre: Handicapped height Bathroom Accessibility: Yes How Accessible: Accessible via walker Home Equipment: Arthur - 2 wheels;Cane - single point;Bedside commode;Shower seat;Grab bars - toilet;Grab bars - tub/shower          Prior Functioning/Environment Level of Independence: Independent        Comments: works as a Scientist, clinical (histocompatibility and immunogenetics) in [employer]        OT Problem List: Decreased strength;Decreased range of motion;Decreased coordination;Impaired UE functional use;Pain;Increased edema      OT Treatment/Interventions: Self-care/ADL training;Therapeutic exercise;Therapeutic activities;DME and/or AE instruction;Patient/family education;Neuromuscular education    OT Goals(Current goals can be found in the care plan section) Acute Rehab OT Goals Patient Stated Goal: to be able to use her hand normally again OT Goal Formulation: With patient Time For Goal Achievement: 03/12/20 Potential to Achieve Goals: Good  OT Frequency: Min 3X/week   Barriers to D/C:            Co-evaluation              AM-PAC OT "6 Clicks" Daily Activity     Outcome Measure Help from another person eating meals?: A Little Help from another person taking care of personal grooming?: A Little Help from another person toileting, which includes using toliet, bedpan, or urinal?: A Little Help from another person bathing (including washing, rinsing, drying)?: A Little Help from another person to put on and taking off regular upper body clothing?: A Little Help from another person to put on and taking off regular lower body clothing?: A Little 6 Click Score: 18   End of Session Nurse Communication: Patient requests pain meds;Other (comment) (encourage digit ROM and elevation)  Activity Tolerance: Patient tolerated treatment well Patient left: in bed;with call bell/phone within reach  OT Visit Diagnosis: Muscle weakness (generalized) (M62.81);Pain Pain - Right/Left:  Left Pain - part of body: Hand                Time: 6295-2841 OT Time Calculation (min): 18 min Charges:  OT General Charges $OT Visit: 1 Visit OT Evaluation $OT Eval Low Complexity: [place], OT/L   Acute OT Clinical Specialist [place] Pager [phone] Office [phone]   [facility] 02/27/2020, 3:07 PM

## 2020-02-27 NOTE — Progress Notes (Signed)
Patient ID: Danielle Nolan, female   DOB: 03-25-1976, 44 y.o.   MRN: [id number] Patient I had a long conversation day in regards to the upper extremity predicament.  I will plan for dressing change tomorrow.  I discussed with her the Streptococcus early results and the fact that we are waiting on final culture results.  We discussed these issues in detail.  We will plan for dressing change and move forward accordingly.  Dr. Amedeo Plenty

## 2020-02-28 DIAGNOSIS — B950 Streptococcus, group A, as the cause of diseases classified elsewhere: Secondary | ICD-10-CM

## 2020-02-28 DIAGNOSIS — L02512 Cutaneous abscess of left hand: Principal | ICD-10-CM

## 2020-02-28 DIAGNOSIS — M659 Synovitis and tenosynovitis, unspecified: Secondary | ICD-10-CM

## 2020-02-28 LAB — CBC WITH DIFFERENTIAL/PLATELET
Abs Immature Granulocytes: 0.03 10*3/uL (ref 0.00–0.07)
Basophils Absolute: 0 10*3/uL (ref 0.0–0.1)
Basophils Relative: 0 %
Eosinophils Absolute: 0 10*3/uL (ref 0.0–0.5)
Eosinophils Relative: 1 %
HCT: 38 % (ref 36.0–46.0)
Hemoglobin: 12.6 g/dL (ref 12.0–15.0)
Immature Granulocytes: 0 %
Lymphocytes Relative: 36 %
Lymphs Abs: 2.5 10*3/uL (ref 0.7–4.0)
MCH: 27.6 pg (ref 26.0–34.0)
MCHC: 33.2 g/dL (ref 30.0–36.0)
MCV: 83.2 fL (ref 80.0–100.0)
Monocytes Absolute: 0.6 10*3/uL (ref 0.1–1.0)
Monocytes Relative: 8 %
Neutro Abs: 3.7 10*3/uL (ref 1.7–7.7)
Neutrophils Relative %: 55 %
Platelets: 240 10*3/uL (ref 150–400)
RBC: 4.57 MIL/uL (ref 3.87–5.11)
RDW: 12.5 % (ref 11.5–15.5)
WBC: 6.9 10*3/uL (ref 4.0–10.5)
nRBC: 0 % (ref 0.0–0.2)

## 2020-02-28 LAB — COMPREHENSIVE METABOLIC PANEL
ALT: 47 U/L — ABNORMAL HIGH (ref 0–44)
AST: 33 U/L (ref 15–41)
Albumin: 2.9 g/dL — ABNORMAL LOW (ref 3.5–5.0)
Alkaline Phosphatase: 115 U/L (ref 38–126)
Anion gap: 11 (ref 5–15)
BUN: 6 mg/dL (ref 6–20)
CO2: 26 mmol/L (ref 22–32)
Calcium: 8.6 mg/dL — ABNORMAL LOW (ref 8.9–10.3)
Chloride: 103 mmol/L (ref 98–111)
Creatinine, Ser: 0.7 mg/dL (ref 0.44–1.00)
GFR calc Af Amer: >60 mL/min (ref >60)
GFR calc non Af Amer: >60 mL/min (ref >60)
Glucose, Bld: 91 mg/dL (ref 70–99)
Potassium: 3.5 mmol/L (ref 3.5–5.1)
Sodium: 140 mmol/L (ref 135–145)
Total Bilirubin: 0.2 mg/dL — ABNORMAL LOW (ref 0.3–1.2)
Total Protein: 6 g/dL — ABNORMAL LOW (ref 6.5–8.1)

## 2020-02-28 LAB — FERRITIN: Ferritin: 57 ng/mL (ref 11–307)

## 2020-02-28 LAB — D-DIMER, QUANTITATIVE: D-Dimer, Quant: 0.56 ug/mL-FEU — ABNORMAL HIGH (ref 0.00–0.50)

## 2020-02-28 LAB — C-REACTIVE PROTEIN: CRP: 0.9 mg/dL (ref <1.0)

## 2020-02-28 MED ORDER — HYDROCODONE-ACETAMINOPHEN 7.5-325 MG PO TABS: 1.0000 | ORAL_TABLET | Freq: Four times a day (QID) | ORAL | 0 refills | Status: AC | PRN

## 2020-02-28 MED ORDER — SODIUM CHLORIDE 0.9 % IV SOLN
1.0000 g | Freq: Once | INTRAVENOUS | Status: AC
  Administered 2020-02-28: 1 g via INTRAVENOUS
  Filled 2020-02-28: qty 1

## 2020-02-28 MED ORDER — MUPIROCIN 2 % EX OINT: 1.0000 "application " | TOPICAL_OINTMENT | Freq: Two times a day (BID) | CUTANEOUS | 0 refills | Status: AC

## 2020-02-28 MED ORDER — SULFAMETHOXAZOLE-TRIMETHOPRIM 800-160 MG PO TABS: 1.0000 | ORAL_TABLET | Freq: Two times a day (BID) | ORAL | 0 refills | Status: AC

## 2020-02-28 NOTE — Progress Notes (Signed)
Patient ID: Danielle Nolan, female   DOB: March 25, 1976, 44 y.o.   MRN: [id number] Patient is here today for follow-up.  No signs of instability infection or dystrophy.  Patient is stable ligamentous exam.  The patient has full sensation to the fingertips.  The dressing has been removed and I performed cleansing of the area followed by formal dressing change.  She has some noted improvement and certainly lessening of her cellulitic and erythematous area.  Given the cultures and her findings on exam today I do feel that she is stable to be transition to outpatient evaluation and treatment.  I would recommend antibiotics for 2 weeks tailored to the cultures and would recommend follow-up in 10 days in my office in conjunction with her husband who is also a Covid patient with a severe hand infection.  All questions have been addressed.  Esperanza Madrazo MD
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
## 2020-02-28 NOTE — Plan of Care (Signed)
  Problem: Clinical Measurements: Goal: Ability to avoid or minimize complications of infection will improve Outcome: Progressing   Problem: Skin Integrity: Goal: Skin integrity will improve Outcome: Progressing   Problem: Education: Goal: Knowledge of risk factors and measures for prevention of condition will improve Outcome: Progressing   Problem: Coping: Goal: Psychosocial and spiritual needs will be supported Outcome: Progressing   Problem: Respiratory: Goal: Will maintain a patent airway Outcome: Progressing Goal: Complications related to the disease process, condition or treatment will be avoided or minimized Outcome: Progressing

## 2020-02-28 NOTE — Progress Notes (Addendum)
   Subjective:   Patient reports to have continued pain in her left hand. She states she had mild flu like symptoms about two weeks ago and thought she had a cold. She states she only has minor congestion at this time but no difficulty breathing.   Objective:  Vital signs in last 24 hours: Vitals:   02/27/20 1655 02/27/20 2005 02/27/20 2006 02/27/20 2035  BP: 105/73 131/84 131/84   Pulse: 66 82 82   Resp: 19  18   Temp: 97.9 F (36.6 C) 98.3 F (36.8 C) 98.3 F (36.8 C)   TempSrc:   Oral   SpO2: 100%  100% (!) 69%  Weight:        Supplemental Oxygen: none  Constitution: NAD, appears stated age Cardio: RRR, no m/r/g, no LE edema  Respiratory: CTA, no w/r/r Abdominal: NTTP, soft, non-distended MSK: moving all extremities Neuro: normal affect, a&ox3 Skin: c/d/i   Assessment/Plan:  Active Problems:   Cellulitis and abscess of hand   COVID-19 virus infection   Transaminitis  44yo female with no known PMH presenting with left hand cellulitis after cutting her hand on a can lid and mildly symptomatic covid-19.   [facility] day 5.  Patient is not requiring any supplemental oxygen.  Mild symptoms of cough and congestion.  D-dimer remains elevated.    - robitussen q4h prn - IS, flutter valve - will need covid vaccine after discharge - TOC consulted to be established with PCP  Left Hand Cellulitis with abscess and tenosynovitis  Patient is status post superficial debridement of the left thumb as well as exploration of tenosynovitis and abscess on the left fourth finger on 9/10 in day second washout on 9/12. Cultures grew enterobacter and group A strep. Patient has completed 4 days of vancomycin. Will continue on IV antibiotics during the course of hospital stay and transition to oral antibiotics at discharge, pending surgery's recommendations.  - Discontinued vanc, continuing ceftriaxone. Will transition to oral antibiotics at discharge. - continue dilaudid 0.5 mg q4h  prn, hydrocodone 7.5 mg q6h  Transaminitis  Liver enzymes have improved, denies abdominal pain, negative ultrasound.   VTE: lovenox IVF: none Diet: regular Code: full  Dispo: Anticipated discharge pending surgical treatment.    Jarrell Armond N, DO 02/28/2020, 7:18 AM Pager: [phone]. After 5pm on weekdays and 1pm on weekends: On Call Pager: [phone]

## 2020-02-28 NOTE — Progress Notes (Addendum)
Pt is wanting to leave tonight. I asked pt can she stay till the morning for her attending to d/c her. Pt states that she is having home related issues with children. Reached out to on call doctor and asked him to come to bedside. Stated that he will be down. Charge nurse spoke with [facility] and pts ride has to be here and she has to be  escorted down because of covid diagnosis. Pt is very sorry but her husband is admitted with covid as well on 5w. She stated that he is sicker than she is so she has to leave.   Tele leads and IV removed.   2035 On call doctors @bedside  and will place d/c order.   Pt left before d/c order inputted.

## 2020-02-29 LAB — CULTURE, BLOOD (ROUTINE X 2)
Culture: NO GROWTH
Culture: NO GROWTH
Special Requests: ADEQUATE
Special Requests: ADEQUATE

## 2020-03-01 LAB — AEROBIC/ANAEROBIC CULTURE W GRAM STAIN (SURGICAL/DEEP WOUND)
Gram Stain: NONE SEEN
Gram Stain: NONE SEEN

## 2020-03-04 LAB — ACID FAST SMEAR (AFB, MYCOBACTERIA): Acid Fast Smear: NEGATIVE

## 2020-03-05 LAB — ACID FAST SMEAR (AFB, MYCOBACTERIA): Acid Fast Smear: NEGATIVE

## 2020-03-12 ENCOUNTER — Encounter: Payer: Self-pay | Admitting: Student

## 2020-03-28 LAB — FUNGUS CULTURE WITH STAIN

## 2020-03-28 LAB — FUNGUS CULTURE RESULT

## 2020-03-28 LAB — FUNGAL ORGANISM REFLEX

## 2020-04-16 LAB — ACID FAST CULTURE WITH REFLEXED SENSITIVITIES (MYCOBACTERIA): Acid Fast Culture: NEGATIVE

## 2020-04-23 LAB — ACID FAST CULTURE WITH REFLEXED SENSITIVITIES (MYCOBACTERIA): Acid Fast Culture: NEGATIVE

## 2020-11-13 ENCOUNTER — Encounter (HOSPITAL_COMMUNITY): Payer: Self-pay

## 2020-11-13 ENCOUNTER — Emergency Department (HOSPITAL_COMMUNITY)
Admission: EM | Admit: 2020-11-13 | Discharge: 2020-11-14 | Discharge status: Against Medical Advice | Payer: Self-pay | Attending: Emergency Medicine | Admitting: Emergency Medicine

## 2020-11-13 ENCOUNTER — Other Ambulatory Visit: Payer: Self-pay

## 2020-11-13 DIAGNOSIS — Z5321 Procedure and treatment not carried out due to patient leaving prior to being seen by health care provider: Secondary | ICD-10-CM | POA: Insufficient documentation

## 2020-11-13 DIAGNOSIS — S81812A Laceration without foreign body, left lower leg, initial encounter: Secondary | ICD-10-CM | POA: Insufficient documentation

## 2020-11-13 DIAGNOSIS — W268XXA Contact with other sharp object(s), not elsewhere classified, initial encounter: Secondary | ICD-10-CM | POA: Insufficient documentation

## 2020-11-13 MED ORDER — ACETAMINOPHEN 325 MG PO TABS
650.0000 mg | ORAL_TABLET | Freq: Once | ORAL | Status: AC
  Administered 2020-11-13: 650 mg via ORAL
  Filled 2020-11-13: qty 2
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
## 2020-11-14 NOTE — ED Notes (Signed)
Patient called for room x3 with no resposne

## 2020-11-16 ENCOUNTER — Other Ambulatory Visit: Payer: Self-pay

## 2020-11-16 ENCOUNTER — Encounter (HOSPITAL_COMMUNITY): Payer: Self-pay

## 2020-11-16 ENCOUNTER — Ambulatory Visit (HOSPITAL_COMMUNITY)
Admission: EM | Admit: 2020-11-16 | Discharge: 2020-11-16 | Disposition: A | Discharge status: Home / Self Care | Payer: 59

## 2020-11-16 ENCOUNTER — Emergency Department (HOSPITAL_COMMUNITY)
Admission: EM | Admit: 2020-11-16 | Discharge: 2020-11-16 | Disposition: A | Discharge status: Home / Self Care | Payer: 59 | Attending: Emergency Medicine | Admitting: Emergency Medicine

## 2020-11-16 ENCOUNTER — Encounter (HOSPITAL_COMMUNITY): Payer: Self-pay | Admitting: Emergency Medicine

## 2020-11-16 DIAGNOSIS — Z5321 Procedure and treatment not carried out due to patient leaving prior to being seen by health care provider: Secondary | ICD-10-CM | POA: Diagnosis not present

## 2020-11-16 DIAGNOSIS — W260XXA Contact with knife, initial encounter: Secondary | ICD-10-CM | POA: Insufficient documentation

## 2020-11-16 DIAGNOSIS — S8992XA Unspecified injury of left lower leg, initial encounter: Secondary | ICD-10-CM | POA: Insufficient documentation

## 2020-11-16 NOTE — ED Notes (Signed)
Pt did not answer for room. 

## 2020-11-16 NOTE — ED Notes (Signed)
The patient did not respond updated vitals

## 2020-11-16 NOTE — Discharge Instructions (Signed)
Please go to emergency department for wound evaluation
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
## 2022-01-09 IMAGING — US US ABDOMEN LIMITED
1 series · 14 of 25 positions shown · non-contrast
Comparison: Abdominal ultrasound dated 12/10/2017

CLINICAL DATA: Transaminitis.

EXAM:
ULTRASOUND ABDOMEN LIMITED RIGHT UPPER QUADRANT

[Series 1: us abdomen limited ruq · 14 of 27 slices shown]
[im 1/27]
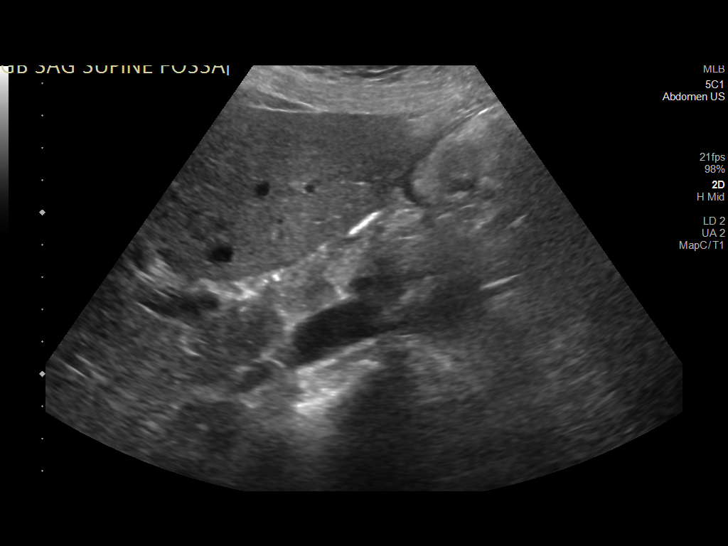
[im 3/27]
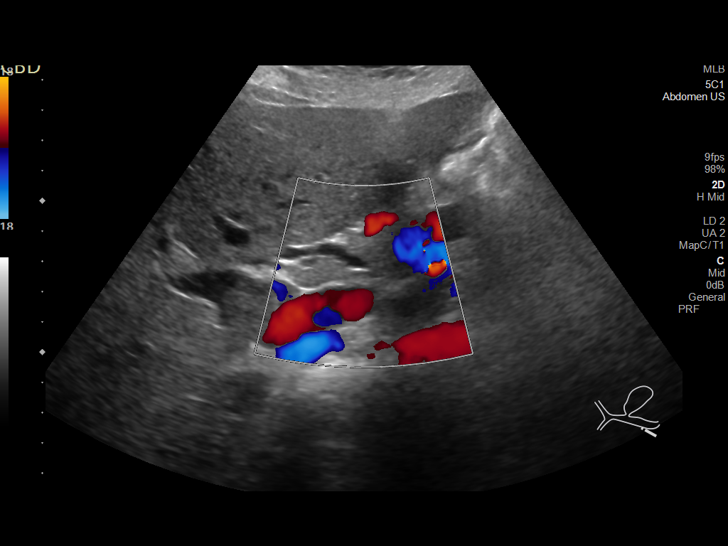
[im 5/27]
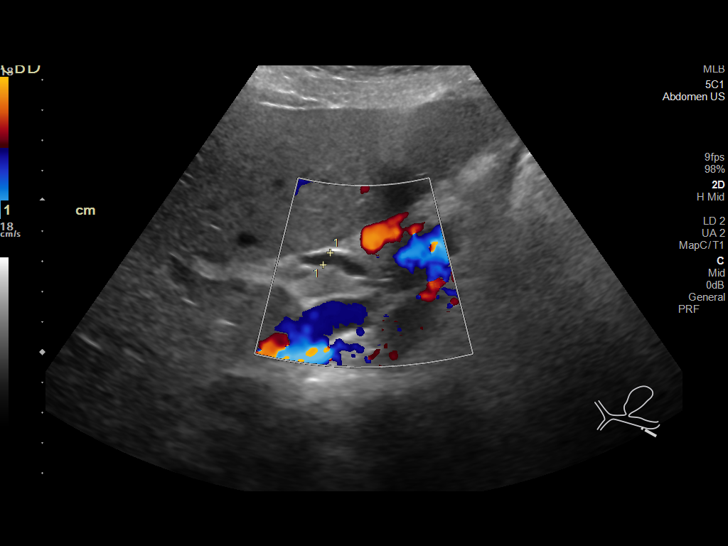
[im 7/27]
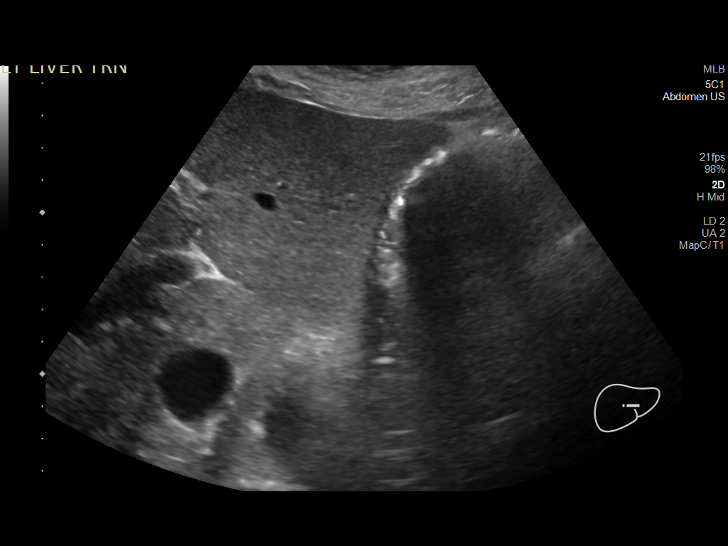
[im 9/27]
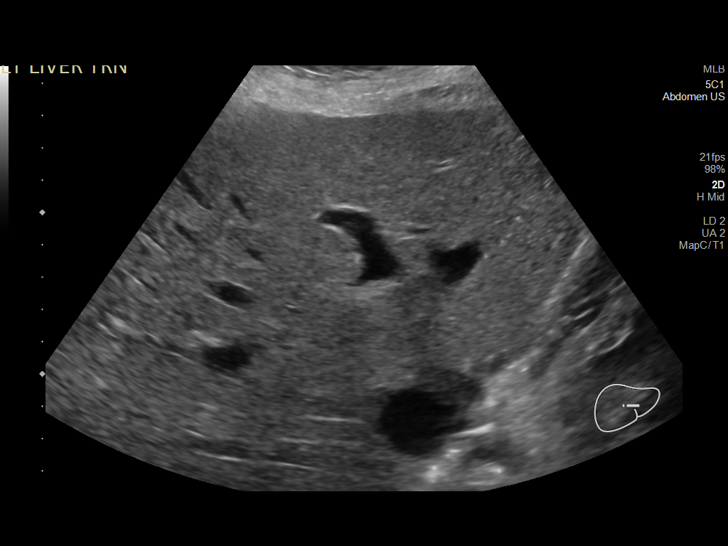
[im 10/27]
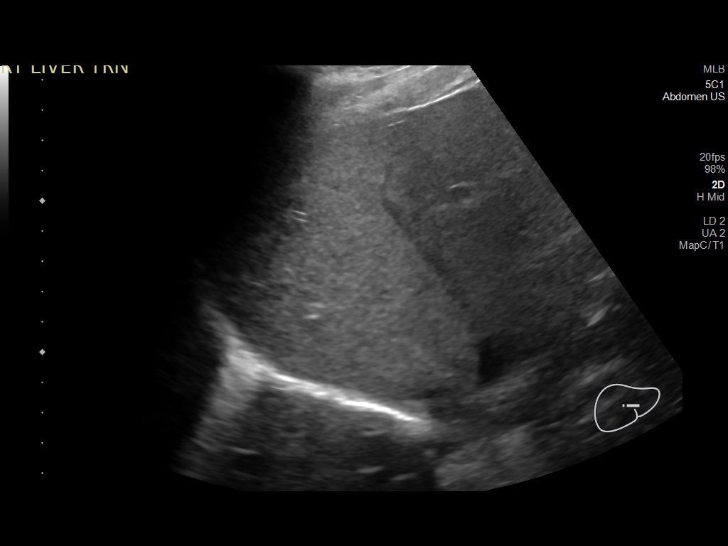
[im 12/27]
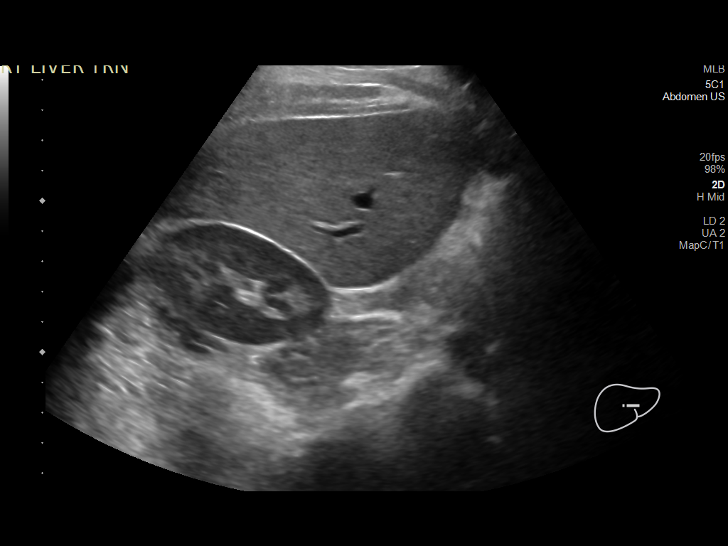
[im 15/27]
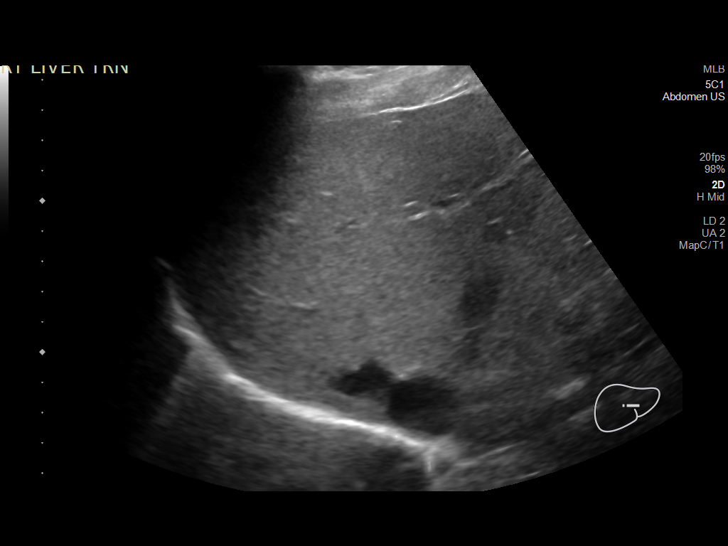
[im 17/27]
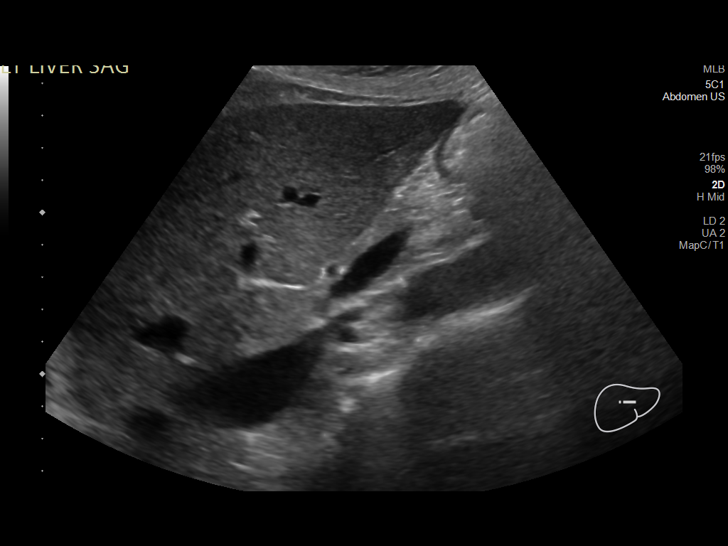
[im 18/27]
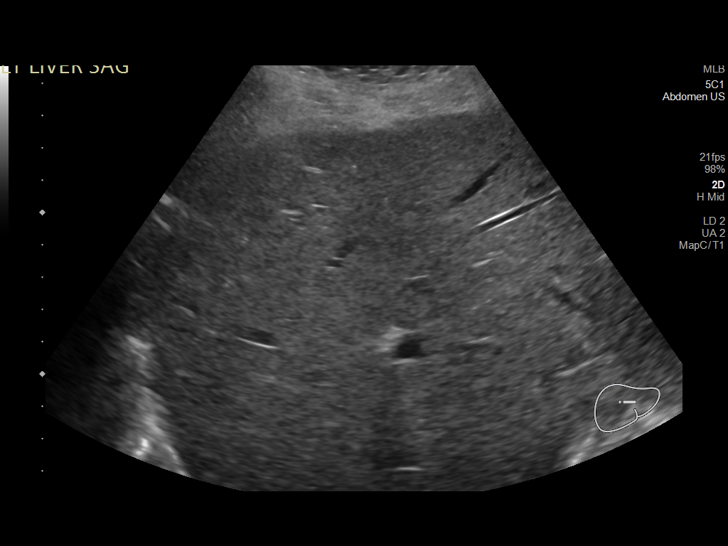
[im 20/27]
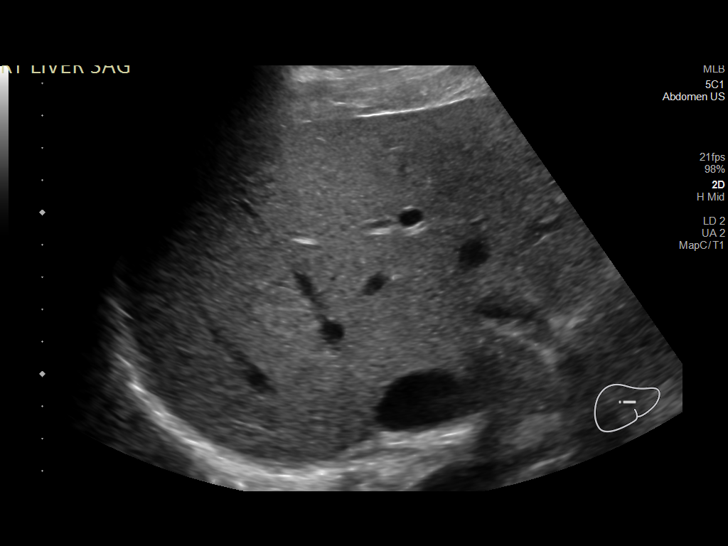
[im 22/27]
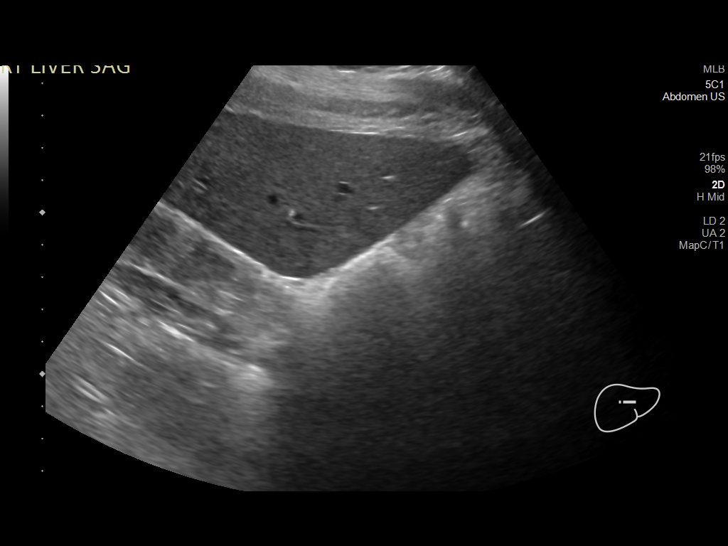
[im 24/27]
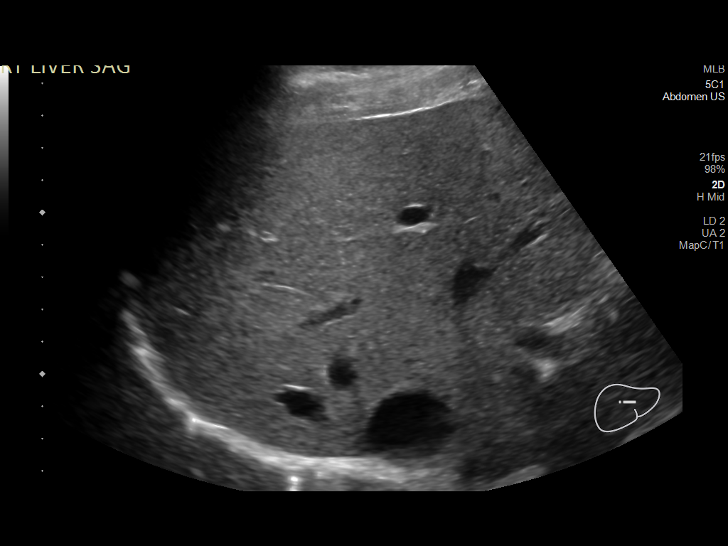
[im 27/27]
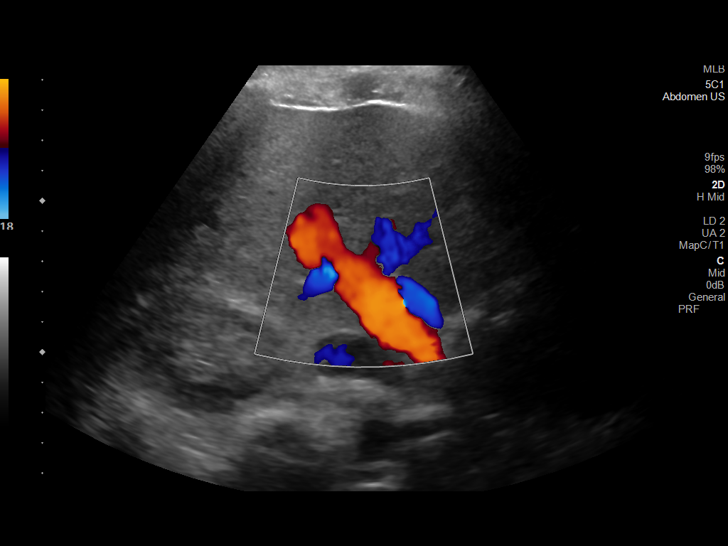

[14 of 25 positions shown; findings below may reference images not displayed]

FINDINGS: Gallbladder:

Surgically absent.

Common bile duct:

Diameter: 5 mm

Liver:

No focal lesion identified. Within normal limits in parenchymal
echogenicity. Portal vein is patent on color Doppler imaging with
normal direction of blood flow towards the liver.

Other: None.
IMPRESSION: No findings to explain transaminitis.

## 2022-05-15 ENCOUNTER — Emergency Department (HOSPITAL_COMMUNITY)
Admission: EM | Admit: 2022-05-15 | Discharge: 2022-05-16 | Disposition: A | Discharge status: Home / Self Care | Payer: Commercial Managed Care - HMO | Attending: Emergency Medicine | Admitting: Emergency Medicine

## 2022-05-15 ENCOUNTER — Other Ambulatory Visit: Payer: Self-pay

## 2022-05-15 ENCOUNTER — Emergency Department (HOSPITAL_COMMUNITY): Payer: Commercial Managed Care - HMO

## 2022-05-15 DIAGNOSIS — S50812A Abrasion of left forearm, initial encounter: Secondary | ICD-10-CM

## 2022-05-15 DIAGNOSIS — S52045A Nondisplaced fracture of coronoid process of left ulna, initial encounter for closed fracture: Secondary | ICD-10-CM | POA: Insufficient documentation

## 2022-05-15 DIAGNOSIS — M79632 Pain in left forearm: Secondary | ICD-10-CM | POA: Diagnosis present

## 2022-05-15 DIAGNOSIS — F17210 Nicotine dependence, cigarettes, uncomplicated: Secondary | ICD-10-CM | POA: Insufficient documentation

## 2022-05-15 MED ORDER — FENTANYL CITRATE PF 50 MCG/ML IJ SOSY
100.0000 ug | PREFILLED_SYRINGE | Freq: Once | INTRAMUSCULAR | Status: AC
  Administered 2022-05-16: 100 ug via INTRAVENOUS
  Filled 2022-05-15: qty 2
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
## 2022-05-16 MED ORDER — HYDROCODONE-ACETAMINOPHEN 5-325 MG PO TABS: 1.0000 | ORAL_TABLET | Freq: Four times a day (QID) | ORAL | 0 refills | Status: DC | PRN

## 2022-05-16 NOTE — Progress Notes (Signed)
Orthopedic Tech Progress Note Patient Details:  Danielle Nolan 12-Mar-1976 [id number]  Ortho Devices Type of Ortho Device: Sugartong splint, Arm sling Ortho Device/Splint Location: lue Ortho Device/Splint Interventions: Ordered, Application, Adjustment  An ed tech held for me. Post Interventions Patient Tolerated: Well Instructions Provided: Care of device, Adjustment of device  Karolee Stamps 05/16/2022, 12:45 AM

## 2022-05-19 ENCOUNTER — Ambulatory Visit (INDEPENDENT_AMBULATORY_CARE_PROVIDER_SITE_OTHER): Payer: Commercial Managed Care - HMO | Admitting: Orthopedic Surgery

## 2022-05-19 ENCOUNTER — Encounter: Payer: Self-pay | Admitting: Physician Assistant

## 2022-05-19 ENCOUNTER — Encounter (HOSPITAL_COMMUNITY): Payer: Self-pay | Admitting: Orthopedic Surgery

## 2022-05-19 ENCOUNTER — Other Ambulatory Visit: Payer: Self-pay

## 2022-05-19 DIAGNOSIS — S52252A Displaced comminuted fracture of shaft of ulna, left arm, initial encounter for closed fracture: Secondary | ICD-10-CM

## 2022-05-19 NOTE — Progress Notes (Signed)
Ms Danielle Nolan denies chest pain or shortness of breath.  Patient denies having any s/s of Covid in her household, also denies any known exposure to Covid.   Ms Danielle Nolan has ADHD and is bipolar, patient is unable to afford medications.

## 2022-05-20 ENCOUNTER — Encounter (HOSPITAL_COMMUNITY)
Admission: RE | Disposition: A | Discharge status: Home / Self Care | Payer: Self-pay | Source: Home / Self Care | Attending: Orthopedic Surgery

## 2022-05-20 ENCOUNTER — Ambulatory Visit (HOSPITAL_COMMUNITY)
Admission: RE | Admit: 2022-05-20 | Discharge: 2022-05-20 | Disposition: A | Discharge status: Home / Self Care | Payer: Commercial Managed Care - HMO | Attending: Orthopedic Surgery | Admitting: Orthopedic Surgery

## 2022-05-20 ENCOUNTER — Encounter (HOSPITAL_COMMUNITY): Payer: Self-pay | Admitting: Orthopedic Surgery

## 2022-05-20 ENCOUNTER — Other Ambulatory Visit: Payer: Self-pay

## 2022-05-20 ENCOUNTER — Ambulatory Visit (HOSPITAL_COMMUNITY): Payer: Commercial Managed Care - HMO | Admitting: Certified Registered Nurse Anesthetist

## 2022-05-20 ENCOUNTER — Ambulatory Visit (HOSPITAL_BASED_OUTPATIENT_CLINIC_OR_DEPARTMENT_OTHER): Payer: Commercial Managed Care - HMO | Admitting: Certified Registered Nurse Anesthetist

## 2022-05-20 ENCOUNTER — Ambulatory Visit (HOSPITAL_COMMUNITY): Payer: Commercial Managed Care - HMO

## 2022-05-20 DIAGNOSIS — X58XXXA Exposure to other specified factors, initial encounter: Secondary | ICD-10-CM | POA: Insufficient documentation

## 2022-05-20 DIAGNOSIS — S52252A Displaced comminuted fracture of shaft of ulna, left arm, initial encounter for closed fracture: Secondary | ICD-10-CM

## 2022-05-20 DIAGNOSIS — S52202A Unspecified fracture of shaft of left ulna, initial encounter for closed fracture: Secondary | ICD-10-CM | POA: Insufficient documentation

## 2022-05-20 DIAGNOSIS — F17210 Nicotine dependence, cigarettes, uncomplicated: Secondary | ICD-10-CM | POA: Insufficient documentation

## 2022-05-20 DIAGNOSIS — Z01818 Encounter for other preprocedural examination: Secondary | ICD-10-CM

## 2022-05-20 HISTORY — DX: Depression, unspecified: F32.A

## 2022-05-20 HISTORY — DX: Anxiety disorder, unspecified: F41.9

## 2022-05-20 HISTORY — DX: Other psychoactive substance abuse, uncomplicated: F19.10

## 2022-05-20 HISTORY — DX: Other complications of anesthesia, initial encounter: T88.59XA

## 2022-05-20 HISTORY — DX: Bipolar disorder, unspecified: F31.9

## 2022-05-20 HISTORY — PX: ORIF ULNAR FRACTURE: SHX5417

## 2022-05-20 LAB — BASIC METABOLIC PANEL
Anion gap: 10 (ref 5–15)
BUN: 10 mg/dL (ref 6–20)
CO2: 26 mmol/L (ref 22–32)
Calcium: 9.5 mg/dL (ref 8.9–10.3)
Chloride: 102 mmol/L (ref 98–111)
Creatinine, Ser: 0.83 mg/dL (ref 0.44–1.00)
GFR, Estimated: >60 mL/min (ref >60)
Glucose, Bld: 100 mg/dL — ABNORMAL HIGH (ref 70–99)
Potassium: 3.3 mmol/L — ABNORMAL LOW (ref 3.5–5.1)
Sodium: 138 mmol/L (ref 135–145)

## 2022-05-20 LAB — CBC
HCT: 39.4 % (ref 36.0–46.0)
Hemoglobin: 13.3 g/dL (ref 12.0–15.0)
MCH: 27.2 pg (ref 26.0–34.0)
MCHC: 33.8 g/dL (ref 30.0–36.0)
MCV: 80.6 fL (ref 80.0–100.0)
Platelets: 212 10*3/uL (ref 150–400)
RBC: 4.89 MIL/uL (ref 3.87–5.11)
RDW: 14 % (ref 11.5–15.5)
WBC: 7.4 10*3/uL (ref 4.0–10.5)
nRBC: 0 % (ref 0.0–0.2)

## 2022-05-20 LAB — VITAMIN D 25 HYDROXY (VIT D DEFICIENCY, FRACTURES): Vit D, 25-Hydroxy: 18.07 ng/mL — ABNORMAL LOW (ref 30–100)

## 2022-05-20 LAB — POCT PREGNANCY, URINE: Preg Test, Ur: NEGATIVE

## 2022-05-20 SURGERY — OPEN REDUCTION INTERNAL FIXATION (ORIF) ULNAR FRACTURE
Anesthesia: General | Laterality: Left

## 2022-05-20 MED ORDER — OXYCODONE-ACETAMINOPHEN 5-325 MG PO TABS: 1.0000 | ORAL_TABLET | ORAL | 0 refills | Status: AC | PRN

## 2022-05-20 MED ORDER — ORAL CARE MOUTH RINSE: 15.0000 mL | Freq: Once | OROMUCOSAL | Status: AC

## 2022-05-20 MED ORDER — ONDANSETRON HCL 4 MG/2ML IJ SOLN
INTRAMUSCULAR | Status: AC
  Filled 2022-05-20: qty 2

## 2022-05-20 MED ORDER — FENTANYL CITRATE (PF) 100 MCG/2ML IJ SOLN: 100.0000 ug | Freq: Once | INTRAMUSCULAR | Status: AC

## 2022-05-20 MED ORDER — MORPHINE SULFATE (PF) 4 MG/ML IV SOLN
INTRAVENOUS | Status: AC
  Filled 2022-05-20: qty 2

## 2022-05-20 MED ORDER — CHLORHEXIDINE GLUCONATE 0.12 % MT SOLN
OROMUCOSAL | Status: AC
  Administered 2022-05-20: 15 mL via OROMUCOSAL
  Filled 2022-05-20: qty 15

## 2022-05-20 MED ORDER — 0.9 % SODIUM CHLORIDE (POUR BTL) OPTIME
TOPICAL | Status: DC | PRN
  Administered 2022-05-20 (×2): 1000 mL

## 2022-05-20 MED ORDER — BUPIVACAINE-EPINEPHRINE (PF) 0.5% -1:200000 IJ SOLN
INTRAMUSCULAR | Status: DC | PRN
  Administered 2022-05-20: 25 mL via PERINEURAL

## 2022-05-20 MED ORDER — CEFAZOLIN SODIUM-DEXTROSE 2-4 GM/100ML-% IV SOLN
2.0000 g | INTRAVENOUS | Status: AC
  Administered 2022-05-20: 2 g via INTRAVENOUS

## 2022-05-20 MED ORDER — CELECOXIB 100 MG PO CAPS: 100.0000 mg | ORAL_CAPSULE | Freq: Two times a day (BID) | ORAL | 0 refills | Status: AC

## 2022-05-20 MED ORDER — FENTANYL CITRATE (PF) 100 MCG/2ML IJ SOLN: 25.0000 ug | INTRAMUSCULAR | Status: DC | PRN

## 2022-05-20 MED ORDER — METHOCARBAMOL 500 MG PO TABS: 500.0000 mg | ORAL_TABLET | Freq: Three times a day (TID) | ORAL | 1 refills | Status: DC | PRN

## 2022-05-20 MED ORDER — OXYCODONE HCL 5 MG PO TABS: 5.0000 mg | ORAL_TABLET | Freq: Once | ORAL | Status: DC | PRN

## 2022-05-20 MED ORDER — POVIDONE-IODINE 7.5 % EX SOLN
Freq: Once | CUTANEOUS | Status: DC
  Filled 2022-05-20: qty 118

## 2022-05-20 MED ORDER — PROPOFOL 10 MG/ML IV BOLUS
INTRAVENOUS | Status: DC | PRN
  Administered 2022-05-20: 150 mg via INTRAVENOUS

## 2022-05-20 MED ORDER — CLONIDINE HCL (ANALGESIA) 100 MCG/ML EP SOLN
EPIDURAL | Status: AC
  Filled 2022-05-20: qty 10

## 2022-05-20 MED ORDER — TRANEXAMIC ACID-NACL 1000-0.7 MG/100ML-% IV SOLN
INTRAVENOUS | Status: AC
  Filled 2022-05-20: qty 100

## 2022-05-20 MED ORDER — CEFAZOLIN SODIUM-DEXTROSE 2-4 GM/100ML-% IV SOLN
INTRAVENOUS | Status: AC
  Filled 2022-05-20: qty 100

## 2022-05-20 MED ORDER — TRANEXAMIC ACID-NACL 1000-0.7 MG/100ML-% IV SOLN
1000.0000 mg | INTRAVENOUS | Status: AC
  Administered 2022-05-20: 1000 mg via INTRAVENOUS

## 2022-05-20 MED ORDER — BUPIVACAINE HCL (PF) 0.25 % IJ SOLN
INTRAMUSCULAR | Status: DC | PRN
  Administered 2022-05-20: 10 mL

## 2022-05-20 MED ORDER — CHLORHEXIDINE GLUCONATE 0.12 % MT SOLN: 15.0000 mL | Freq: Once | OROMUCOSAL | Status: AC

## 2022-05-20 MED ORDER — PROMETHAZINE HCL 25 MG/ML IJ SOLN: 6.2500 mg | INTRAMUSCULAR | Status: DC | PRN

## 2022-05-20 MED ORDER — DEXAMETHASONE SODIUM PHOSPHATE 10 MG/ML IJ SOLN
INTRAMUSCULAR | Status: DC | PRN
  Administered 2022-05-20: 5 mg via INTRAVENOUS

## 2022-05-20 MED ORDER — MORPHINE SULFATE (PF) 4 MG/ML IV SOLN
INTRAVENOUS | Status: DC | PRN
  Administered 2022-05-20: 8 mg via SUBCUTANEOUS

## 2022-05-20 MED ORDER — MIDAZOLAM HCL 2 MG/2ML IJ SOLN
INTRAMUSCULAR | Status: AC
  Administered 2022-05-20: 2 mg via INTRAVENOUS
  Filled 2022-05-20: qty 2

## 2022-05-20 MED ORDER — POVIDONE-IODINE 10 % EX SWAB
2.0000 | Freq: Once | CUTANEOUS | Status: AC
  Administered 2022-05-20: 2 via TOPICAL

## 2022-05-20 MED ORDER — ACETAMINOPHEN 500 MG PO TABS
ORAL_TABLET | ORAL | Status: AC
  Filled 2022-05-20: qty 2

## 2022-05-20 MED ORDER — OXYCODONE HCL 5 MG/5ML PO SOLN: 5.0000 mg | Freq: Once | ORAL | Status: DC | PRN

## 2022-05-20 MED ORDER — PROPOFOL 500 MG/50ML IV EMUL
INTRAVENOUS | Status: DC | PRN
  Administered 2022-05-20: 50 ug/kg/min via INTRAVENOUS

## 2022-05-20 MED ORDER — BUPIVACAINE HCL (PF) 0.25 % IJ SOLN
INTRAMUSCULAR | Status: AC
  Filled 2022-05-20: qty 10

## 2022-05-20 MED ORDER — DEXMEDETOMIDINE HCL IN NACL 200 MCG/50ML IV SOLN
INTRAVENOUS | Status: DC | PRN
  Administered 2022-05-20: 8 ug via INTRAVENOUS

## 2022-05-20 MED ORDER — ONDANSETRON HCL 4 MG/2ML IJ SOLN
INTRAMUSCULAR | Status: DC | PRN
  Administered 2022-05-20: 4 mg via INTRAVENOUS

## 2022-05-20 MED ORDER — LACTATED RINGERS IV SOLN: INTRAVENOUS | Status: DC

## 2022-05-20 MED ORDER — CLONIDINE HCL (ANALGESIA) 100 MCG/ML EP SOLN
EPIDURAL | Status: DC | PRN
  Administered 2022-05-20: 1 mL

## 2022-05-20 MED ORDER — ACETAMINOPHEN 500 MG PO TABS
1000.0000 mg | ORAL_TABLET | Freq: Once | ORAL | Status: AC
  Administered 2022-05-20: 1000 mg via ORAL

## 2022-05-20 MED ORDER — FENTANYL CITRATE (PF) 100 MCG/2ML IJ SOLN
INTRAMUSCULAR | Status: AC
  Administered 2022-05-20: 100 ug via INTRAVENOUS
  Filled 2022-05-20: qty 2

## 2022-05-20 MED ORDER — VITAMIN D (ERGOCALCIFEROL) 1.25 MG (50000 UNIT) PO CAPS: 50000.0000 [IU] | ORAL_CAPSULE | ORAL | 0 refills | Status: AC

## 2022-05-20 MED ORDER — MIDAZOLAM HCL 2 MG/2ML IJ SOLN: 2.0000 mg | Freq: Once | INTRAMUSCULAR | Status: AC

## 2022-05-20 SURGICAL SUPPLY — 64 items
BAG COUNTER SPONGE SURGICOUNT (BAG) ×2 IMPLANT
BAG SPNG CNTER NS LX DISP (BAG) ×1
BIT DRILL 110X2.5XQCK CNCT (BIT) IMPLANT
BIT DRILL 2.5 (BIT) ×1
BIT DRILL 2.5X205 (BIT) IMPLANT
BIT DRILL 2.7X100 LONG (BIT) IMPLANT
BIT DRILL Q-C 2.0 DIA 100 (BIT) IMPLANT
BIT DRL 110X2.5XQCK CNCT (BIT) ×1
BLADE SURG 10 STRL SS (BLADE) ×2 IMPLANT
BNDG CMPR 9X4 STRL LF SNTH (GAUZE/BANDAGES/DRESSINGS) ×1
BNDG ELASTIC 4X5.8 VLCR STR LF (GAUZE/BANDAGES/DRESSINGS) ×2 IMPLANT
BNDG ESMARK 4X9 LF (GAUZE/BANDAGES/DRESSINGS) IMPLANT
CLSR STERI-STRIP ANTIMIC 1/2X4 (GAUZE/BANDAGES/DRESSINGS) IMPLANT
CORD BIPOLAR FORCEPS 12FT (ELECTRODE) ×2 IMPLANT
COVER SURGICAL LIGHT HANDLE (MISCELLANEOUS) ×2 IMPLANT
CUFF TOURN SGL QUICK 18X4 (TOURNIQUET CUFF) ×2 IMPLANT
DRAPE INCISE IOBAN 66X45 STRL (DRAPES) IMPLANT
DRAPE OEC MINIVIEW 54X84 (DRAPES) IMPLANT
DRAPE U-SHAPE 47X51 STRL (DRAPES) ×2 IMPLANT
DRILL BIT 2.5X205 (BIT) ×1
DRSG AQUACEL AG ADV 3.5X10 (GAUZE/BANDAGES/DRESSINGS) IMPLANT
DURAPREP 26ML APPLICATOR (WOUND CARE) ×2 IMPLANT
ELECT REM PT RETURN 9FT ADLT (ELECTROSURGICAL) ×1
ELECTRODE REM PT RTRN 9FT ADLT (ELECTROSURGICAL) ×2 IMPLANT
FACESHIELD WRAPAROUND (MASK) ×1 IMPLANT
FACESHIELD WRAPAROUND OR TEAM (MASK) ×2 IMPLANT
GAUZE SPONGE 4X4 12PLY STRL (GAUZE/BANDAGES/DRESSINGS) IMPLANT
GLOVE BIO SURGEON ST LM GN SZ9 (GLOVE) ×2 IMPLANT
GLOVE BIOGEL PI IND STRL 8 (GLOVE) ×2 IMPLANT
GLOVE ECLIPSE 8.0 STRL XLNG CF (GLOVE) ×2 IMPLANT
GOWN STRL REUS W/ TWL LRG LVL3 (GOWN DISPOSABLE) ×4 IMPLANT
GOWN STRL REUS W/ TWL XL LVL3 (GOWN DISPOSABLE) ×2 IMPLANT
GOWN STRL REUS W/TWL LRG LVL3 (GOWN DISPOSABLE) ×2
GOWN STRL REUS W/TWL XL LVL3 (GOWN DISPOSABLE) ×1
KIT BASIN OR (CUSTOM PROCEDURE TRAY) ×2 IMPLANT
KIT TURNOVER KIT B (KITS) ×2 IMPLANT
MANIFOLD NEPTUNE II (INSTRUMENTS) ×2 IMPLANT
NS IRRIG 1000ML POUR BTL (IV SOLUTION) ×2 IMPLANT
PACK ORTHO EXTREMITY (CUSTOM PROCEDURE TRAY) ×2 IMPLANT
PAD ARMBOARD 7.5X6 YLW CONV (MISCELLANEOUS) ×4 IMPLANT
PLATE TUBULAR LOCK 1/5 7HOLE (Plate) IMPLANT
SCREW CORT 2.5X20X3.5XST SM (Screw) IMPLANT
SCREW CORT S/T 3.5X22 (Screw) IMPLANT
SCREW CORTICAL 2.7X14MM (Screw) IMPLANT
SCREW CORTICAL 3.5 16MM (Screw) IMPLANT
SCREW CORTICAL 3.5 18MM (Screw) IMPLANT
SCREW CORTICAL 3.5X14 (Screw) IMPLANT
SCREW CORTICAL 3.5X20 (Screw) ×1 IMPLANT
SLING ARM FOAM STRAP LRG (SOFTGOODS) IMPLANT
SPONGE T-LAP 4X18 ~~LOC~~+RFID (SPONGE) ×4 IMPLANT
STRIP CLOSURE SKIN 1/2X4 (GAUZE/BANDAGES/DRESSINGS) ×2 IMPLANT
SUCTION FRAZIER HANDLE 10FR (MISCELLANEOUS) ×1
SUCTION TUBE FRAZIER 10FR DISP (MISCELLANEOUS) ×2 IMPLANT
SUT ETHILON 3 0 PS 1 (SUTURE) IMPLANT
SUT MNCRL AB 3-0 PS2 18 (SUTURE) IMPLANT
SUT VIC AB 2-0 CT1 27 (SUTURE) ×5
SUT VIC AB 2-0 CT1 TAPERPNT 27 (SUTURE) IMPLANT
SUT VIC AB 2-0 CTB1 (SUTURE) IMPLANT
SUT VIC AB 3-0 X1 27 (SUTURE) IMPLANT
TOWEL GREEN STERILE (TOWEL DISPOSABLE) ×2 IMPLANT
TOWEL GREEN STERILE FF (TOWEL DISPOSABLE) ×2 IMPLANT
TUBE CONNECTING 12X1/4 (SUCTIONS) ×2 IMPLANT
WATER STERILE IRR 1000ML POUR (IV SOLUTION) ×2 IMPLANT
YANKAUER SUCT BULB TIP NO VENT (SUCTIONS) IMPLANT

## 2022-05-20 NOTE — Anesthesia Preprocedure Evaluation (Addendum)
Anesthesia Evaluation  Patient identified by MRN, date of birth, ID band Patient awake    Reviewed: Allergy & Precautions, NPO status , Patient's Chart, lab work & pertinent test results  History of Anesthesia Complications Negative for: history of anesthetic complications  Airway Mallampati: II  TM Distance: >3 FB Neck ROM: Full    Dental  (+) Dental Advisory Given   Pulmonary Current SmokerPatient did not abstain from smoking.   Pulmonary exam normal        Cardiovascular negative cardio ROS Normal cardiovascular exam     Neuro/Psych  PSYCHIATRIC DISORDERS Anxiety Depression Bipolar Disorder   negative neurological ROS     GI/Hepatic negative GI ROS, Neg liver ROS,,,  Endo/Other  negative endocrine ROS    Renal/GU negative Renal ROS     Musculoskeletal negative musculoskeletal ROS (+)    Abdominal   Peds  Hematology negative hematology ROS (+)   Anesthesia Other Findings   Reproductive/Obstetrics                             Anesthesia Physical Anesthesia Plan  ASA: 2  Anesthesia Plan: MAC   Post-op Pain Management: Tylenol PO (pre-op)* and Regional block*   Induction: Intravenous  PONV Risk Score and Plan: 1 and Treatment may vary due to age or medical condition and Propofol infusion  Airway Management Planned: Natural Airway and Simple Face Mask  Additional Equipment: None  Intra-op Plan:   Post-operative Plan: Extubation in OR  Informed Consent: I have reviewed the patients History and Physical, chart, labs and discussed the procedure including the risks, benefits and alternatives for the proposed anesthesia with the patient or authorized representative who has indicated his/her understanding and acceptance.     Dental advisory given  Plan Discussed with: CRNA and Anesthesiologist  Anesthesia Plan Comments:        Anesthesia Quick Evaluation

## 2022-05-20 NOTE — Transfer of Care (Signed)
Immediate Anesthesia Transfer of Care Note  Patient: Danielle Nolan  Procedure(s) Performed: OPEN REDUCTION INTERNAL FIXATION (ORIF) LEFT ULNAR FRACTURE (Left)  Patient Location: PACU  Anesthesia Type:General  Level of Consciousness: awake and alert   Airway & Oxygen Therapy: Patient Spontanous Breathing and Patient connected to face mask oxygen  Post-op Assessment: Report given to RN and Post -op Vital signs reviewed and stable  Post vital signs: Reviewed and stable  Last Vitals:  Vitals Value Taken Time  BP 151/82 05/20/22 2005  Temp    Pulse 74 05/20/22 2010  Resp 18 05/20/22 2010  SpO2 100 % 05/20/22 2010  Vitals shown include unvalidated device data.  Last Pain:  Vitals:   05/20/22 1810  TempSrc:   PainSc: 0-No pain         Complications: No notable events documented.

## 2022-05-20 NOTE — Anesthesia Procedure Notes (Signed)
Anesthesia Regional Block: Supraclavicular block   Pre-Anesthetic Checklist: , timeout performed,  Correct Patient, Correct Site, Correct Laterality,  Correct Procedure, Correct Position, site marked,  Risks and benefits discussed,  Surgical consent,  Pre-op evaluation,  At surgeon's request and post-op pain management  Laterality: Left and Upper  Prep: chloraprep       Needles:  Injection technique: Single-shot  Needle Type: Echogenic Needle     Needle Length: 9cm  Needle Gauge: 21     Additional Needles:   Procedures:,,,, ultrasound used (permanent image in chart),,    Narrative:  Start time: 05/20/2022 5:48 PM End time: 05/20/2022 5:54 PM Injection made incrementally with aspirations every 5 mL.  Performed by: Personally  Anesthesiologist: Annye Asa, MD  Additional Notes: Pt identified in Holding room.  Monitors applied. Working IV access confirmed. Sterile prep L clavicle and neck.  #21ga ECHOgenic Arrow block needle to supraclavicular brachial plexus with US guidance.  25cc 0.5% Bupivacaine 1:200k epi injected incrementally after test dose: initial pain on injection with test resolved with repositioning of needle.  Patient asymptomatic, VSS, no heme aspirated, tolerated well.   [name], MD

## 2022-05-20 NOTE — Anesthesia Postprocedure Evaluation (Signed)
Anesthesia Post Note  Patient: Haunani Dickard  Procedure(s) Performed: OPEN REDUCTION INTERNAL FIXATION (ORIF) LEFT ULNAR FRACTURE (Left)     Patient location during evaluation: PACU Anesthesia Type: General and Regional Level of consciousness: awake and alert, patient cooperative and oriented Pain management: pain level controlled Vital Signs Assessment: post-procedure vital signs reviewed and stable Respiratory status: spontaneous breathing, nonlabored ventilation and respiratory function stable Cardiovascular status: blood pressure returned to baseline and stable Postop Assessment: no apparent nausea or vomiting, adequate PO intake and able to ambulate Anesthetic complications: no   No notable events documented.  Last Vitals:  Vitals:   05/20/22 2030 05/20/22 2045  BP: (!) 150/94 (!) 143/98  Pulse: 69 69  Resp: 15 15  Temp:    SpO2: 97% 97%    Last Pain:  Vitals:   05/20/22 2045  TempSrc:   PainSc: 0-No pain                 Shailah Gibbins,E. [place]
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
## 2022-05-20 NOTE — Brief Op Note (Signed)
   05/20/2022  8:09 PM  PATIENT:  Danielle Nolan  46 y.o. female  PRE-OPERATIVE DIAGNOSIS:  left ulna fracture  POST-OPERATIVE DIAGNOSIS:  left ulna fracture  PROCEDURE:  Procedure(s): OPEN REDUCTION INTERNAL FIXATION (ORIF) LEFT ULNAR FRACTURE  SURGEON:  Surgeon(s): Meredith Pel, MD  ASSISTANT: magnant pa  ANESTHESIA:   general  EBL: 25 ml    Total I/O In: 800 [I.V.:800] Out: -   BLOOD ADMINISTERED: none  DRAINS: none   LOCAL MEDICATIONS USED:  marcaine mso4 clonidine  SPECIMEN:  No Specimen  COUNTS:  YES  TOURNIQUET:   Total Tourniquet Time Documented: Upper Arm (Left) - 37 minutes Total: Upper Arm (Left) - 37 minutes   DICTATION: .Other Dictation: Dictation Number [id number]  PLAN OF CARE: Discharge to home after PACU  PATIENT DISPOSITION:  PACU - hemodynamically stable

## 2022-05-20 NOTE — Anesthesia Procedure Notes (Signed)
Procedure Name: LMA Insertion Date/Time: 05/20/2022 6:33 PM  Performed by: Alain Marion, CRNAPre-anesthesia Checklist: Patient identified, Emergency Drugs available, Suction available and Patient being monitored Patient Re-evaluated:Patient Re-evaluated prior to induction Oxygen Delivery Method: Circle System Utilized Preoxygenation: Pre-oxygenation with 100% oxygen Induction Type: IV induction Ventilation: Mask ventilation without difficulty LMA: LMA inserted LMA Size: 4.0 Number of attempts: 1 Airway Equipment and Method: Bite block Placement Confirmation: positive ETCO2 Tube secured with: Tape Dental Injury: Teeth and Oropharynx as per pre-operative assessment

## 2022-05-20 NOTE — Op Note (Unsigned)
[name], [name] MEDICAL RECORD NO: [id number] ACCOUNT NO: [id number] DATE OF BIRTH: 1975-12-13 FACILITY: [facility] LOCATION: [facility] PHYSICIAN: Yetta Barre. Marlou Sa, MD  Operative Report   DATE OF PROCEDURE: 05/20/2022  PREOPERATIVE DIAGNOSIS:  Left displaced ulnar shaft fracture.  POSTOPERATIVE DIAGNOSIS:  Left displaced ulnar shaft fracture.  PROCEDURE:  Left ulnar shaft fracture open reduction and internal fixation.  SURGEON:  Yetta Barre. Marlou Sa, MD  ASSISTANT:  Annie Main, PA  INDICATIONS:  The patient is a 46 year old patient with displaced left ulnar shaft fracture, who presents for operative management after explanation of risks and benefits.  PROCEDURE DETAILS:  The patient was brought to the operating room where general anesthetic was induced.  Preoperative antibiotics administered.  Timeout was called.  Left arm was prescrubbed with alcohol and Betadine, allowed to air dry, prepped with  DuraPrep solution and draped in sterile manner.  Ioban used to cover the operative field.  After calling timeout, the fracture location was visualized under fluoroscopy and incision was made along the ulnar shaft.  Skin and subcutaneous tissue sharply  divided.  Fascia overlying the ulnar aspect on the ulnar shaft was incised.  Fracture was identified.  Subperiosteal dissection was performed on the volar and dorsal aspect of the ulna with protection of the ulnar nerve at all times.  Next, the fracture  was reduced and held with reduction clamps.  Lag screw placed dorsal to volar.  This reduced the fracture nicely.  Then, a 7-hole Biomet plate was applied in compression fashion with good reduction of the fracture site.  Correct placement of the screws  was confirmed in the AP and lateral planes under fluoroscopy.  At this time, the tourniquet was released. Bleeding points encountered controlled using electrocautery.  Thorough irrigation was performed.  Fascia then closed over the plate using 2-0  Vicryl  suture followed by interrupted inverted 2-0 Vicryl suture, 3-0 Monocryl and Steri-Strips applied with Aquacel dressing.  Ace wrap and sling applied.  The patient tolerated the procedure well without immediate complication.  Luke's assistance was  required for opening, closing, mobilization of tissue.  His assistance was a medical necessity.   VAI D: 05/20/2022 8:12:27 pm T: 05/20/2022 9:03:00 pm  JOB: [id number]/ [id number]

## 2022-05-21 ENCOUNTER — Encounter (HOSPITAL_COMMUNITY): Payer: Self-pay | Admitting: Orthopedic Surgery

## 2022-05-22 ENCOUNTER — Encounter: Payer: Self-pay | Admitting: Orthopedic Surgery
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
## 2022-05-23 ENCOUNTER — Telehealth: Payer: Self-pay | Admitting: Orthopedic Surgery

## 2022-05-23 NOTE — Telephone Encounter (Signed)
I called Danielle Nolan.  She is doing well from her surgery.  Having expected amount of postop pain.  Some swelling.  Overall making progress.  We will see her next week.  Hand functioning well.

## 2022-05-24 DIAGNOSIS — S52252A Displaced comminuted fracture of shaft of ulna, left arm, initial encounter for closed fracture: Secondary | ICD-10-CM

## 2022-05-28 ENCOUNTER — Ambulatory Visit (INDEPENDENT_AMBULATORY_CARE_PROVIDER_SITE_OTHER): Payer: Commercial Managed Care - HMO | Admitting: Surgical

## 2022-05-28 ENCOUNTER — Ambulatory Visit (INDEPENDENT_AMBULATORY_CARE_PROVIDER_SITE_OTHER): Payer: Commercial Managed Care - HMO

## 2022-05-28 ENCOUNTER — Encounter: Payer: Self-pay | Admitting: Surgical

## 2022-05-28 DIAGNOSIS — S52252A Displaced comminuted fracture of shaft of ulna, left arm, initial encounter for closed fracture: Secondary | ICD-10-CM

## 2022-05-28 NOTE — Progress Notes (Signed)
Post-Op Visit Note   Patient: Danielle Nolan           Date of Birth: May 26, 1976           MRN: [id number] Visit Date: 05/28/2022 PCP: Patient, No Pcp Per   Assessment & Plan:  Chief Complaint:  Chief Complaint  Patient presents with   Other    05/20/22 (8d) Open Reduction Internal Fixation (orif) Left Ulnar Fracture       Visit Diagnoses:  1. Closed displaced comminuted fracture of shaft of left ulna, initial encounter     Plan: Patient is a 46 year old female who presents s/p left ulnar fracture ORIF on 05/20/2022.  Doing well overall and pain is fairly controlled.  She is holding off on smoking.  Taking vitamin D supplementation due to initially low vitamin D at the time of surgery.  She has no difficulty with motor function in her hand.  No fevers or chills or any drainage from the incision.  On exam, patient has 2+ radial pulse of the operative extremity.  Intact EPL, FPL, finger abduction, finger adduction, grip strength testing, wrist extension.  Incision is healing well without evidence of infection or dehiscence.  She does have some swelling around the fracture site as expected.  No expressible drainage from the incision.  Plan is to use forearm brace.  No lifting more than 1 pound.  Hold off on smoking is much as she can.  Follow-up in 3 weeks for clinical recheck with Dr. Marlou Sa.  New radiographs at that time.  Follow-Up Instructions: No follow-ups on file.   Orders:  Orders Placed This Encounter  Procedures   XR Forearm Left   No orders of the defined types were placed in this encounter.   Imaging: No results found.  PMFS History: Patient Active Problem List   Diagnosis Date Noted   Closed displaced comminuted fracture of shaft of left ulna 05/24/2022   Transaminitis    Cellulitis and abscess of hand 02/24/2020   COVID-19 virus infection    Cellulitis of finger of left hand    Hypokalemia    Acute cholecystitis 12/10/2017   Other and unspecified ovarian  cyst 01/27/2013   Past Medical History:  Diagnosis Date   Anxiety    Bipolar disorder ([place])    Complication of anesthesia    "panic attack when they started to put the mask"   Depression    Fibroid    Ovarian cyst    Substance abuse ([place])     Family History  Problem Relation Age of Onset   Hypertension Maternal Grandmother    Heart disease Maternal Grandmother    Cancer Maternal Grandmother    Hypertension Maternal Grandfather    Diabetes Maternal Grandfather    Cancer Maternal Grandfather    Hypertension Paternal Grandmother    Hypertension Paternal Grandfather     Past Surgical History:  Procedure Laterality Date   BTL     CHOLECYSTECTOMY N/A 12/11/2017   Procedure: LAPAROSCOPIC CHOLECYSTECTOMY WITH INTRAOPERATIVE CHOLANGIOGRAM;  Surgeon: Donnie Mesa, MD;  [facility];  Service: General;  Laterality: N/A;   Cryo Therapy     done at age 45.  Dx with cervical cancer   I & D EXTREMITY Left 02/24/2020   Procedure: IRRIGATION AND DEBRIDEMENT LEFT HAND RING FINGER;  Surgeon: Roseanne Kaufman, MD;  Location: [facility];  Service: Orthopedics;  Laterality: Left;   I & D EXTREMITY Left 02/26/2020   Procedure: IRRIGATION AND DEBRIDEMENT EXTREMITY;  Surgeon: Roseanne Kaufman,  MD;  Location: [facility];  Service: Orthopedics;  Laterality: Left;   ORIF ULNAR FRACTURE Left 05/20/2022   Procedure: OPEN REDUCTION INTERNAL FIXATION (ORIF) LEFT ULNAR FRACTURE;  Surgeon: Meredith Pel, MD;  Location: [facility];  Service: Orthopedics;  Laterality: Left;   Social History   Occupational History   Not on file  Tobacco Use   Smoking status: Every Day    Packs/day: 0.40    Years: 31.00    Total pack years: 12.40    Types: Cigarettes   Smokeless tobacco: Never  Vaping Use   Vaping Use: Some days   Substances: CBD  Substance and Sexual Activity   Alcohol use: Not Currently    Comment: occasionally   Drug use: Yes    Types: Other-see comments    Comment: history of amphetamine use, CBD vapes-  last time 05/12/22   Sexual activity: Yes    Birth control/protection: Surgical

## 2022-06-19 ENCOUNTER — Ambulatory Visit: Payer: Commercial Managed Care - HMO | Admitting: Surgical

## 2022-07-04 ENCOUNTER — Ambulatory Visit (INDEPENDENT_AMBULATORY_CARE_PROVIDER_SITE_OTHER): Payer: 59 | Admitting: Surgical

## 2022-07-04 ENCOUNTER — Ambulatory Visit (INDEPENDENT_AMBULATORY_CARE_PROVIDER_SITE_OTHER): Payer: 59

## 2022-07-04 ENCOUNTER — Encounter: Payer: Self-pay | Admitting: Surgical

## 2022-07-04 DIAGNOSIS — S52252A Displaced comminuted fracture of shaft of ulna, left arm, initial encounter for closed fracture: Secondary | ICD-10-CM

## 2022-07-04 NOTE — Progress Notes (Signed)
Post-Op Visit Note   Patient: Danielle Nolan           Date of Birth: Feb 10, 1976           MRN: [id number] Visit Date: 07/04/2022 PCP: Patient, No Pcp Per   Assessment & Plan:  Chief Complaint:  Chief Complaint  Patient presents with   Left Arm - Pain   Visit Diagnoses:  1. Closed displaced comminuted fracture of shaft of left ulna, initial encounter     Plan: Patient is a 47 year old female who presents for reevaluation of left ulnar shaft fracture s/p ORIF 05/20/2022.  Overall she feels she is doing well and progressing without any plateau yet.  She states that the incision is healed up well.  She has been progressing her lifting.  She is trying to hold off on smoking as much as she can as she is smoking less than half of a pack per day.  She is sleeping well at night though she cannot lay on her left arm yet.  Not doing any vigorous heavy lifting.  She will occasionally do household chores such as vacuuming.  On exam, patient has intact EPL, FPL, finger abduction, grip strength testing, wrist extension.  No difficulty with pronation/supination but she does lack about 10 degrees of supination compared with contralateral side.  She has incision from recent surgery that is healing very well.  There is no evidence of infection or dehiscence.  She does have continued tenderness over the fracture site mild to moderately.  She is able to actively pronate and supinate and flex/extend her elbow.  Radiographs taken today demonstrate continued fracture line.  There is no evidence of hardware failure.  Looks to be early callus formation and fracture line is less visible than previously but with her continued tenderness on exam, plan to hold off on any heavy lifting continually.  She will follow-up in 3 to 4 weeks for clinical recheck with Dr. Marlou Sa with new radiographs at that time.  Suspect that she is healing the fracture but it is a slower process due to her initially low vitamin D status as well  as her smoking background.  Follow-Up Instructions: No follow-ups on file.   Orders:  Orders Placed This Encounter  Procedures   XR Forearm Left   No orders of the defined types were placed in this encounter.   Imaging: No results found.  PMFS History: Patient Active Problem List   Diagnosis Date Noted   Closed displaced comminuted fracture of shaft of left ulna 05/24/2022   Transaminitis    Cellulitis and abscess of hand 02/24/2020   COVID-19 virus infection    Cellulitis of finger of left hand    Hypokalemia    Acute cholecystitis 12/10/2017   Other and unspecified ovarian cyst 01/27/2013   Past Medical History:  Diagnosis Date   Anxiety    Bipolar disorder ([place])    Complication of anesthesia    "panic attack when they started to put the mask"   Depression    Fibroid    Ovarian cyst    Substance abuse ([place])     Family History  Problem Relation Age of Onset   Hypertension Maternal Grandmother    Heart disease Maternal Grandmother    Cancer Maternal Grandmother    Hypertension Maternal Grandfather    Diabetes Maternal Grandfather    Cancer Maternal Grandfather    Hypertension Paternal Grandmother    Hypertension Paternal Grandfather     Past Surgical History:  Procedure Laterality Date   BTL     CHOLECYSTECTOMY N/A 12/11/2017   Procedure: LAPAROSCOPIC CHOLECYSTECTOMY WITH INTRAOPERATIVE CHOLANGIOGRAM;  Surgeon: Donnie Mesa, MD;  Location: [facility];  Service: General;  Laterality: N/A;   Cryo Therapy     done at age 47.  Dx with cervical cancer   I & D EXTREMITY Left 02/24/2020   Procedure: IRRIGATION AND DEBRIDEMENT LEFT HAND RING FINGER;  Surgeon: Roseanne Kaufman, MD;  Location: [facility];  Service: Orthopedics;  Laterality: Left;   I & D EXTREMITY Left 02/26/2020   Procedure: IRRIGATION AND DEBRIDEMENT EXTREMITY;  Surgeon: Roseanne Kaufman, MD;  Location: [facility];  Service: Orthopedics;  Laterality: Left;   ORIF ULNAR FRACTURE Left 05/20/2022   Procedure: OPEN  REDUCTION INTERNAL FIXATION (ORIF) LEFT ULNAR FRACTURE;  Surgeon: Meredith Pel, MD;  Location: [facility];  Service: Orthopedics;  Laterality: Left;   Social History   Occupational History   Not on file  Tobacco Use   Smoking status: Every Day    Packs/day: 0.40    Years: 31.00    Total pack years: 12.40    Types: Cigarettes   Smokeless tobacco: Never  Vaping Use   Vaping Use: Some days   Substances: CBD  Substance and Sexual Activity   Alcohol use: Not Currently    Comment: occasionally   Drug use: Yes    Types: Other-see comments    Comment: history of amphetamine use, CBD vapes- last time 05/12/22   Sexual activity: Yes    Birth control/protection: Surgical

## 2022-07-21 ENCOUNTER — Ambulatory Visit: Payer: 59 | Admitting: Orthopedic Surgery

## 2023-12-01 ENCOUNTER — Other Ambulatory Visit: Payer: Self-pay

## 2023-12-01 ENCOUNTER — Encounter (HOSPITAL_COMMUNITY): Payer: Self-pay

## 2023-12-01 ENCOUNTER — Emergency Department (HOSPITAL_COMMUNITY)
Admission: EM | Admit: 2023-12-01 | Discharge: 2023-12-01 | Disposition: A | Discharge status: Home / Self Care | Attending: Emergency Medicine | Admitting: Emergency Medicine

## 2023-12-01 DIAGNOSIS — F17210 Nicotine dependence, cigarettes, uncomplicated: Secondary | ICD-10-CM | POA: Insufficient documentation

## 2023-12-01 DIAGNOSIS — R1030 Lower abdominal pain, unspecified: Secondary | ICD-10-CM | POA: Insufficient documentation

## 2023-12-01 LAB — COMPREHENSIVE METABOLIC PANEL WITH GFR
ALT: 15 U/L (ref 0–44)
AST: 18 U/L (ref 15–41)
Albumin: 3.1 g/dL — ABNORMAL LOW (ref 3.5–5.0)
Alkaline Phosphatase: 71 U/L (ref 38–126)
Anion gap: 9 (ref 5–15)
BUN: 10 mg/dL (ref 6–20)
CO2: 24 mmol/L (ref 22–32)
Calcium: 8.7 mg/dL — ABNORMAL LOW (ref 8.9–10.3)
Chloride: 105 mmol/L (ref 98–111)
Creatinine, Ser: 0.72 mg/dL (ref 0.44–1.00)
GFR, Estimated: >60 mL/min (ref >60)
Glucose, Bld: 92 mg/dL (ref 70–99)
Potassium: 3.5 mmol/L (ref 3.5–5.1)
Sodium: 138 mmol/L (ref 135–145)
Total Bilirubin: 0.6 mg/dL (ref 0.0–1.2)
Total Protein: 6.3 g/dL — ABNORMAL LOW (ref 6.5–8.1)

## 2023-12-01 LAB — URINALYSIS, ROUTINE W REFLEX MICROSCOPIC
Bilirubin Urine: NEGATIVE
Glucose, UA: NEGATIVE mg/dL
Ketones, ur: 5 mg/dL — AB
Nitrite: NEGATIVE
Protein, ur: 100 mg/dL — AB
Specific Gravity, Urine: 1.016 (ref 1.005–1.030)
WBC, UA: >50 WBC/hpf (ref 0–5)
pH: 8 (ref 5.0–8.0)

## 2023-12-01 LAB — CBC
HCT: 36.3 % (ref 36.0–46.0)
Hemoglobin: 12.2 g/dL (ref 12.0–15.0)
MCH: 26.6 pg (ref 26.0–34.0)
MCHC: 33.6 g/dL (ref 30.0–36.0)
MCV: 79.1 fL — ABNORMAL LOW (ref 80.0–100.0)
Platelets: 229 10*3/uL (ref 150–400)
RBC: 4.59 MIL/uL (ref 3.87–5.11)
RDW: 15.1 % (ref 11.5–15.5)
WBC: 10.3 10*3/uL (ref 4.0–10.5)
nRBC: 0 % (ref 0.0–0.2)

## 2023-12-01 LAB — LIPASE, BLOOD: Lipase: 28 U/L (ref 11–51)

## 2023-12-01 LAB — HCG, SERUM, QUALITATIVE: Preg, Serum: NEGATIVE

## 2023-12-01 MED ORDER — DICYCLOMINE HCL 20 MG PO TABS: 20.0000 mg | ORAL_TABLET | Freq: Two times a day (BID) | ORAL | 0 refills | Status: AC

## 2023-12-01 MED ORDER — CEFADROXIL 500 MG PO CAPS: 500.0000 mg | ORAL_CAPSULE | Freq: Two times a day (BID) | ORAL | 0 refills | Status: AC

## 2023-12-01 MED ORDER — OXYCODONE-ACETAMINOPHEN 5-325 MG PO TABS
2.0000 | ORAL_TABLET | Freq: Once | ORAL | Status: AC
  Administered 2023-12-01: 2 via ORAL
  Filled 2023-12-01: qty 2

## 2023-12-01 NOTE — ED Provider Notes (Signed)
 [facility] Provider Note  CSN: [id number] Arrival date & time: 12/01/23 1324  Chief Complaint(s) Abdominal Pain  HPI Danielle Nolan is a 48 y.o. female with PMHx bipolar, anxiety, depression presenting with lower abdominal pain x 2 days.  Associated with some tingling and urinary frequency.  Reports she also just started her menstrual cycle and has history of severe menstrual cramps and possibly endometriosis.  Trialed ibuprofen  with some improvement.  H/o cholecystectomy in 2019, denies other abdominal surgeries.  Last BM 2 days ago and was loose.  Denies nausea, vomiting and persistent diarrhea.  Denies chest pain and shortness of breath.  Denies fever and flank pain.  + THC use, denies alcohol or other substance use.  Past Medical History Past Medical History:  Diagnosis Date   Anxiety    Bipolar disorder ([facility])    Complication of anesthesia    panic attack when they started to put the mask   Depression    Fibroid    Ovarian cyst    Substance abuse [facility])    Patient Active Problem List   Diagnosis Date Noted   Closed displaced comminuted fracture of shaft of left ulna 05/24/2022   Transaminitis    Cellulitis and abscess of hand 02/24/2020   COVID-19 virus infection    Cellulitis of finger of left hand    Hypokalemia    Acute cholecystitis 12/10/2017   Other and unspecified ovarian cyst 01/27/2013   Home Medication(s) Prior to Admission medications   Medication Sig Start Date End Date Taking? Authorizing Provider  cefadroxil (DURICEF) 500 MG capsule Take 1 capsule (500 mg total) by mouth 2 (two) times daily for 5 days. 12/01/23 12/06/23 Yes Jonne Netters, MD  dicyclomine (BENTYL) 20 MG tablet Take 1 tablet (20 mg total) by mouth 2 (two) times daily. 12/01/23  Yes Jonne Netters, MD  methocarbamol  (ROBAXIN ) 500 MG tablet Take 1 tablet (500 mg total) by mouth every 8 (eight) hours as needed for muscle spasms. 05/20/22    Magnant, Charles L, PA-C  Vitamin D , Ergocalciferol , (DRISDOL ) 1.25 MG (50000 UNIT) CAPS capsule Take 1 capsule (50,000 Units total) by mouth every 7 (seven) days. 05/20/22   Magnant, Arlina Lair                                                                                                                                    Past Surgical History Past Surgical History:  Procedure Laterality Date   BTL     CHOLECYSTECTOMY N/A 12/11/2017   Procedure: LAPAROSCOPIC CHOLECYSTECTOMY WITH INTRAOPERATIVE CHOLANGIOGRAM;  Surgeon: Dareen Ebbing, MD;  Location[facility];  Service: General;  Laterality: N/A;   Cryo Therapy     done at age 19.  Dx with cervical cancer   I & D EXTREMITY Left 02/24/2020   Procedure: IRRIGATION AND DEBRIDEMENT LEFT HAND RING FINGER;  Surgeon: Ronn Cohn, MD;  Location: [facility];  Service: Orthopedics;  Laterality: Left;   I & D EXTREMITY Left 02/26/2020   Procedure: IRRIGATION AND DEBRIDEMENT EXTREMITY;  Surgeon: Ronn Cohn, MD;  Location: [facility];  Service: Orthopedics;  Laterality: Left;   ORIF ULNAR FRACTURE Left 05/20/2022   Procedure: OPEN REDUCTION INTERNAL FIXATION (ORIF) LEFT ULNAR FRACTURE;  Surgeon: Jasmine Mesi, MD;  Location: [facility] OR;  Service: Orthopedics;  Laterality: Left;   Family History Family History  Problem Relation Age of Onset   Hypertension Maternal Grandmother    Heart disease Maternal Grandmother    Cancer Maternal Grandmother    Hypertension Maternal Grandfather    Diabetes Maternal Grandfather    Cancer Maternal Grandfather    Hypertension Paternal Grandmother    Hypertension Paternal Grandfather     Social History Social History   Tobacco Use   Smoking status: Every Day    Current packs/day: 0.40    Average packs/day: 0.4 packs/day for 31.0 years (12.4 ttl pk-yrs)    Types: Cigarettes   Smokeless tobacco: Never  Vaping Use   Vaping status: Some Days   Substances: CBD  Substance Use Topics   Alcohol use: Not Currently     Comment: occasionally   Drug use: Yes    Types: Other-see comments    Comment: history of amphetamine use, CBD vapes- last time 05/12/22   Allergies Patient has no known allergies.  Review of Systems A thorough review of systems was obtained and all systems are negative except as noted in the HPI and PMH.   Physical Exam Vital Signs  I have reviewed the triage vital signs BP 132/80   Pulse 70   Temp 98.5 F (36.9 C)   Resp 16   Wt 72.6 kg   SpO2 99%   BMI 25.83 kg/m  Physical Exam General: Well-appearing, alert, NAD Eyes: PERRLA, anicteric sclera ENTM: Moist mucus membranes. Neck: Supple, non-tender Cardiovascular: RRR without murmur Respiratory: CTAB. Normal WOB on RA Gastrointestinal: Soft, non-tender, non-distended MSK: Minimal peripheral edema bilaterally Derm: Warm, dry, no rashes noted Neuro: Motor and sensation intact globally Psych: Cooperative, pleasant   ED Results and Treatments Labs (all labs ordered are listed, but only abnormal results are displayed) Labs Reviewed  COMPREHENSIVE METABOLIC PANEL WITH GFR - Abnormal; Notable for the following components:      Result Value   Calcium 8.7 (*)    Total Protein 6.3 (*)    Albumin 3.1 (*)    All other components within normal limits  CBC - Abnormal; Notable for the following components:   MCV 79.1 (*)    All other components within normal limits  URINALYSIS, ROUTINE W REFLEX MICROSCOPIC - Abnormal; Notable for the following components:   Color, Urine AMBER (*)    APPearance CLOUDY (*)    Hgb urine dipstick SMALL (*)    Ketones, ur 5 (*)    Protein, ur 100 (*)    Leukocytes,Ua SMALL (*)    Bacteria, UA MANY (*)    All other components within normal limits  URINE CULTURE  LIPASE, BLOOD  HCG, SERUM, QUALITATIVE  Radiology No results found.  Pertinent labs & imaging results that  were available during my care of the patient were reviewed by me and considered in my medical decision making (see MDM for details).  Medications Ordered in ED Medications  oxyCODONE -acetaminophen  (PERCOCET/ROXICET) 5-325 MG per tablet 2 tablet (2 tablets Oral Given 12/01/23 2952)                                                                   Medical Decision Making / ED Course    Medical Decision Making:    Danielle Nolan is a 48 y.o. female with PMHx bipolar, anxiety, depression presenting with lower abdominal pain x 2 days.. The complaint involves an extensive differential diagnosis and also carries with it a high risk of complications and morbidity.  Serious etiology was considered. Ddx includes but is not limited to: UTI, pyelonephritis, dysmenorrhea, diverticulitis, gastroenteritis  Complete initial physical exam performed, notably the patient was in no distress.    Reviewed and confirmed nursing documentation for past medical history, family history, social history.  Vital signs reviewed.     Brief summary:  48 year old female with history as above presenting with lower abdominal pain and urinary symptoms.  UA with small leukocytes and hemoglobin although patient is on her menstrual cycle but given history will treat as UTI.  Will send urine culture as well.  Vital stable and clinically well-appearing, low concern for pyelonephritis or intra-abdominal pathology.  CBC, CMP and lipase unremarkable.  Patient has a history of severe menstrual cramps and possibly endometriosis, dysmenorrhea likely playing a role.  Discharged with symptomatic care and recommend establishing with outpatient PCP.     Additional history obtained: -External records from outside source obtained and reviewed including: Chart review including previous notes, labs, imaging, consultation notes including: Prior hospitalization records   Lab Tests: -I ordered, reviewed, and interpreted labs.   The pertinent  results include:   Labs Reviewed  COMPREHENSIVE METABOLIC PANEL WITH GFR - Abnormal; Notable for the following components:      Result Value   Calcium 8.7 (*)    Total Protein 6.3 (*)    Albumin 3.1 (*)    All other components within normal limits  CBC - Abnormal; Notable for the following components:   MCV 79.1 (*)    All other components within normal limits  URINALYSIS, ROUTINE W REFLEX MICROSCOPIC - Abnormal; Notable for the following components:   Color, Urine AMBER (*)    APPearance CLOUDY (*)    Hgb urine dipstick SMALL (*)    Ketones, ur 5 (*)    Protein, ur 100 (*)    Leukocytes,Ua SMALL (*)    Bacteria, UA MANY (*)    All other components within normal limits  URINE CULTURE  LIPASE, BLOOD  HCG, SERUM, QUALITATIVE    EKG   EKG Interpretation Date/Time:    Ventricular Rate:    PR Interval:    QRS Duration:    QT Interval:    QTC Calculation:   R Axis:      Text Interpretation:          Medicines ordered and prescription drug management: Meds ordered this encounter  Medications   oxyCODONE -acetaminophen  (PERCOCET/ROXICET) 5-325 MG per tablet 2 tablet  Refill:  0   dicyclomine (BENTYL) 20 MG tablet    Sig: Take 1 tablet (20 mg total) by mouth 2 (two) times daily.    Dispense:  20 tablet    Refill:  0   cefadroxil (DURICEF) 500 MG capsule    Sig: Take 1 capsule (500 mg total) by mouth 2 (two) times daily for 5 days.    Dispense:  10 capsule    Refill:  0    -I have reviewed the patients home medicines and have made adjustments as needed  Reevaluation: After the interventions noted above, I reevaluated the patient and found that they have improved  Co morbidities that complicate the patient evaluation  Past Medical History:  Diagnosis Date   Anxiety    Bipolar disorder ([facility])    Complication of anesthesia    panic attack when they started to put the mask   Depression    Fibroid    Ovarian cyst    Substance abuse ([facility])        Dispostion: Disposition decision including need for hospitalization was considered, and patient discharged from emergency department.    Final Clinical Impression(s) / ED Diagnoses Final diagnoses:  Lower abdominal pain        Jonne Netters, MD 12/01/23 1308    Roberts Ching, MD 12/01/23 [phone]

## 2023-12-01 NOTE — ED Triage Notes (Signed)
 Pt to ED by EMS from home c/o lower abdominal pain for the past two days. Pt also endorses burning and frequent urination. Arrives A+O, VSS, NADN.

## 2023-12-01 NOTE — Discharge Instructions (Addendum)
 You were seen for abdominal pain and found to have a UTI.  We are treating you with an antibiotic called cefadroxil that you will take twice per day for the next 5 days.  We are also sending urine culture and we will follow-up if a change in antibiotics is needed.  We are also sending you a medication called dicyclomine for your abdominal pain that you can take twice per day for symptom management.  Recommend that you try to establish with a PCP to discuss your vaginal bleeding further as you may need an endometrial biopsy.

## 2023-12-01 NOTE — ED Notes (Addendum)
 MD at bedside.

## 2023-12-01 NOTE — ED Provider Triage Note (Signed)
 Emergency Medicine Provider Triage Evaluation Note  Danielle Nolan , a 48 y.o. female  was evaluated in triage.  Pt complains of lower abdominal pain x2 days.  Has tried ibuprofen  without much relief.  States that she is just starting her period.  Felt fine yesterday, but pain returned today.  Denies n/v/d.  Reports some dysuria.  Reports associated urinary frequency.  Review of Systems  Positive: Dysuria and urinary frequency Negative: fever  Physical Exam  BP 132/80   Pulse 70   Temp 98.5 F (36.9 C)   Resp 16   Wt 72.6 kg   SpO2 94%   BMI 25.83 kg/m  Gen:   Awake, no distress   Resp:  Normal effort  MSK:   Moves extremities without difficulty  Other:    Medical Decision Making  Medically screening exam initiated at 6:26 AM.  Appropriate orders placed.  Frankye Schwegel was informed that the remainder of the evaluation will be completed by another provider, this initial triage assessment does not replace that evaluation, and the importance of remaining in the ED until their evaluation is complete.     Sherel Dikes, PA-C 12/01/23 [phone]

## 2023-12-03 LAB — URINE CULTURE: Culture: >=100000 — AB

## 2023-12-04 ENCOUNTER — Telehealth (HOSPITAL_BASED_OUTPATIENT_CLINIC_OR_DEPARTMENT_OTHER): Payer: Self-pay

## 2023-12-04 NOTE — Telephone Encounter (Signed)
 Post ED Visit - Positive Culture Follow-up  Culture report reviewed by antimicrobial stewardship pharmacist: [facility] [x]  [place], [place].D. []  Skeet Duke, Pharm.D., BCPS AQ-ID []  Leslee Rase, Pharm.D., BCPS []  Garland Junk, Pharm.D., BCPS []  [place], [place].D., BCPS, AAHIVP []  Alcide Aly, Pharm.D., BCPS, AAHIVP []  Jerri Morale, PharmD, BCPS []  Graham Laws, PharmD, BCPS []  Cleda Curly, PharmD, BCPS []  Tamar Fairly, PharmD []  Ballard Levels, PharmD, BCPS []  Ollen Beverage, PharmD  [facility] []  Arlyne Bering, PharmD []  Sherryle Don, PharmD []  [place], PharmD []  Delila Felty, Rph []  [place]) Cleora Daft, PharmD []  Augustina Block, PharmD []  Arie Kurtz, PharmD []  Sharlyn Deaner, PharmD []  Agnes Hose, PharmD []  Kendall Pauls, PharmD []  Gladstone Lamer, PharmD []  Armanda Bern, PharmD []  Tera Fellows, PharmD   Positive urine culture Treated with Cefadroxil, organism sensitive to the same and no further patient follow-up is required at this time.  Delena Feil 12/04/2023, 9:28 AM

## 2024-05-09 ENCOUNTER — Emergency Department (HOSPITAL_COMMUNITY)
Admission: EM | Admit: 2024-05-09 | Discharge: 2024-05-09 | Discharge status: Against Medical Advice | Attending: Emergency Medicine | Admitting: Emergency Medicine

## 2024-05-09 ENCOUNTER — Emergency Department (HOSPITAL_COMMUNITY)

## 2024-05-09 ENCOUNTER — Other Ambulatory Visit: Payer: Self-pay

## 2024-05-09 ENCOUNTER — Encounter (HOSPITAL_COMMUNITY): Payer: Self-pay

## 2024-05-09 DIAGNOSIS — W208XXA Other cause of strike by thrown, projected or falling object, initial encounter: Secondary | ICD-10-CM | POA: Diagnosis not present

## 2024-05-09 DIAGNOSIS — S9031XA Contusion of right foot, initial encounter: Secondary | ICD-10-CM | POA: Diagnosis present

## 2024-05-09 DIAGNOSIS — Z5321 Procedure and treatment not carried out due to patient leaving prior to being seen by health care provider: Secondary | ICD-10-CM | POA: Insufficient documentation

## 2024-05-09 NOTE — ED Notes (Signed)
 Pt called x2 for vitals with no answer.

## 2024-05-09 NOTE — ED Triage Notes (Signed)
 Patient here with a right foot injury. Patient states that a piece of wood fell on her foot. Right foot has deformity and bruising. She can move her toes slightly

## 2024-05-11 ENCOUNTER — Emergency Department (HOSPITAL_BASED_OUTPATIENT_CLINIC_OR_DEPARTMENT_OTHER)

## 2024-05-11 ENCOUNTER — Emergency Department (HOSPITAL_BASED_OUTPATIENT_CLINIC_OR_DEPARTMENT_OTHER)
Admission: EM | Admit: 2024-05-11 | Discharge: 2024-05-11 | Disposition: A | Discharge status: Home / Self Care | Attending: Emergency Medicine | Admitting: Emergency Medicine

## 2024-05-11 DIAGNOSIS — S9031XA Contusion of right foot, initial encounter: Secondary | ICD-10-CM

## 2024-05-11 DIAGNOSIS — S99921A Unspecified injury of right foot, initial encounter: Secondary | ICD-10-CM | POA: Diagnosis present

## 2024-05-11 DIAGNOSIS — S93601A Unspecified sprain of right foot, initial encounter: Secondary | ICD-10-CM | POA: Diagnosis not present

## 2024-05-11 DIAGNOSIS — M7989 Other specified soft tissue disorders: Secondary | ICD-10-CM | POA: Diagnosis not present

## 2024-05-11 DIAGNOSIS — Y93L1 Activity, splitting wood: Secondary | ICD-10-CM | POA: Diagnosis not present

## 2024-05-11 DIAGNOSIS — W208XXA Other cause of strike by thrown, projected or falling object, initial encounter: Secondary | ICD-10-CM | POA: Diagnosis not present

## 2024-05-11 MED ORDER — HYDROCODONE-ACETAMINOPHEN 5-325 MG PO TABS
1.0000 | ORAL_TABLET | Freq: Once | ORAL | Status: AC
  Administered 2024-05-11: 1 via ORAL
  Filled 2024-05-11: qty 1

## 2024-05-11 NOTE — Discharge Instructions (Signed)
 1.Do  Not bear any weight for the next 2-3 days.  Use the crutches every time you walk. 2.  Keep the foot elevated above your heart and you can place ice packs on the foot that where it's swollen 3.  You can take ibuprofen  400 mg 3 times a day for 5 days 4.  If you have any worsening pain, numbness in the right foot, or any new discoloration in the next 48 hours please return to the ER

## 2024-05-11 NOTE — ED Triage Notes (Signed)
 Pt in ambulatory with R foot injury - pt states she had a piece of wood fall on her foot on the night of 11/24. Pain 6/10, pt took Ibu 2hrs ago. States she is only able to walk on her heel. Bruising present, foot is cool.

## 2024-05-11 NOTE — ED Provider Notes (Signed)
 [place] EMERGENCY DEPARTMENT AT [facility] Provider Note   CSN: [id number] Arrival date & time: 05/11/24  0105     Patient presents with: Foot Injury   Danielle Nolan is a 48 y.o. female.   The history is provided by the patient.  Patient presents for continued pain and swelling to her right foot Patient reports that on November 24 she was chopping wood when a piece fell on her right foot.  She had immediate pain and swelling in the right foot. She went to [facility] emergency department, had a foot x-ray but decided to leave prior to completing treatment Since that time the pain and swelling have worsened in the foot.  No new injuries.  She has tried to keep the foot elevated and has been icing it.  No numbness or weakness in her foot No other traumatic injury  No previous surgery to her right foot or ankle She has used a single crutch that she has had at home.  She has not had a formal evaluation with a healthcare provider   Past Medical History:  Diagnosis Date   Anxiety    Bipolar disorder (HCC)    Complication of anesthesia    panic attack when they started to put the mask   Depression    Fibroid    Ovarian cyst    Substance abuse (HCC)     Prior to Admission medications   Medication Sig Start Date End Date Taking? Authorizing Provider  dicyclomine  (BENTYL ) 20 MG tablet Take 1 tablet (20 mg total) by mouth 2 (two) times daily. 12/01/23   Theophilus Pagan, MD  Vitamin D , Ergocalciferol , (DRISDOL ) 1.25 MG (50000 UNIT) CAPS capsule Take 1 capsule (50,000 Units total) by mouth every 7 (seven) days. 05/20/22   Magnant, Carlin CROME, PA-C    Allergies: Patient has no known allergies.    Review of Systems  Musculoskeletal:  Positive for arthralgias and joint swelling.    Updated Vital Signs BP 122/77   Pulse 76   Temp 98.3 F (36.8 C) (Oral)   Resp 17   SpO2 96%   Physical Exam CONSTITUTIONAL: Patient appears disheveled HEAD:  Normocephalic/atraumatic CV: S1/S2 noted, no murmurs/rubs/gallops noted Equal pulses are noted in all 4 extremities LUNGS: Lungs are clear to auscultation bilaterally, no apparent distress NEURO: Pt is awake/alert/appropriate, moves all extremitiesx4.  No facial droop.  She is able to wiggle her toes on the right foot.  She denies any numbness in the right foot EXTREMITIES: The right foot is diffusely swollen, diffusely tender and there is some bruising There is delayed cap refill on the right foot and is currently cool to touch Right PT pulses intact.  Right DP pulse found by Doppler signal. There is no right ankle tenderness There are no wounds on the dorsal or plantar surface of the foot There is no calf tenderness or edema No other tenderness noted to her lower extremity SKIN: Bruising to the right foot with delayed cap refill PSYCH: Anxious  (all labs ordered are listed, but only abnormal results are displayed) Labs Reviewed - No data to display  EKG: None  Radiology: DG Foot Complete Right Result Date: 05/11/2024 EXAM: 3 OR MORE VIEW(S) XRAY OF THE RIGHT FOOT 05/11/2024 01:49:00 AM COMPARISON: 05/09/2024 CLINICAL HISTORY: pain pain FINDINGS: BONES AND JOINTS: No acute fracture. No focal osseous lesion. No joint dislocation. Plantar calcaneal spur. SOFT TISSUES: Soft tissue swelling along the dorsum of the foot overlying the distal metatarsals. IMPRESSION: 1. No  acute bony abnormality. 2. Soft tissue swelling along the dorsum of the foot overlying the distal metatarsals. Electronically signed by: Franky Crease MD 05/11/2024 01:52 AM EST RP Workstation: [id number]   DG Foot Complete Right Result Date: 05/09/2024 EXAM: 3 OR MORE VIEW(S) XRAY OF THE RIGHT FOOT 05/09/2024 06:53:00 PM COMPARISON: 05/03/2019 CLINICAL HISTORY: [id number] Injury [id number] FINDINGS: BONES AND JOINTS: No acute fracture. No focal osseous lesion. No joint dislocation. Early hallux valgus deformity. Plantar calcaneal  spur. SOFT TISSUES: The soft tissues are unremarkable. IMPRESSION: 1. No acute bony abnormality. Electronically signed by: Franky Crease MD 05/09/2024 07:46 PM EST RP Workstation: [id number]     Procedures   Medications Ordered in the ED  HYDROcodone -acetaminophen  (NORCO/VICODIN) 5-325 MG per tablet 1 tablet (1 tablet Oral Given 05/11/24 0151)    Clinical Course as of 05/11/24 0249  Wed May 11, 2024  0148 Patient presents with continued pain and swelling to the right foot after recent injury while chopping wood on November 24.  I personally examined the x-ray from November 24 there is no acute fracture.  However due to increasing pain and swelling we will obtain repeat x-ray. Patient does have a Doppler signal in the right DP and a palpable pulse in the right PT.  However the foot is cool to touch. Will continue to monitor [DW]  0248 Repeat x-rays not reveal any acute fractures. Overall patient feels improved.  The right foot is more warm to touch, and patient does admit to only wearing a wet sock recently. Cap refill in the right foot is much improved and similar to the left foot She denies any numbness in the toes of the right foot and she is able to move her toes. I have low suspicion for acute vascular emergency as there is no underlying fracture.  No signs of any infectious etiology. I have low suspicion for acute compartment syndrome   [DW]  0248 I discussed at length need to use crutches patient was given that by her technician. [DW]  0249   Advised nonweightbearing for up to 3 days. We discussed strict return precautions they were placed in her discharge paperwork [DW]    Clinical Course User Index [DW] Midge Golas, MD                                 Medical Decision Making Amount and/or Complexity of Data Reviewed Radiology: ordered.  Risk Prescription drug management.   This patient presents to the ED for concern of right foot pain, this involves an extensive  number of treatment options, and is a complaint that carries with it a high risk of complications and morbidity.  The differential diagnosis includes but is not limited to foot sprain, occult fracture, acute vascular injury, cellulitis, osteomyelitis, abscess  Comorbidities that complicate the patient evaluation: Patient's presentation is complicated by their history of bipolar disorder  Social Determinants of Health: Patient's previous history of substance use disorder  increases the complexity of managing their presentation  Additional history obtained: Records reviewed previous admission documents  Imaging Studies ordered: I ordered imaging studies including X-ray right foot  I independently visualized and interpreted imaging which showed no acute fracture/dislocation I agree with the radiologist interpretation  Medicines ordered and prescription drug management: I ordered medication including Vicodin for pain Reevaluation of the patient after these medicines showed that the patient    improved  Test Considered: I considered CT imaging the right  lower extremity will defer since patient is improved  Reevaluation: After the interventions noted above, I reevaluated the patient and found that they have :improved  Complexity of problems addressed: Patient's presentation is most consistent with  acute presentation with potential threat to life or bodily function  Disposition: After consideration of the diagnostic results and the patient's response to treatment,  I feel that the patent would benefit from discharge  .        Final diagnoses:  Contusion of right foot, initial encounter  Sprain of right foot, initial encounter    ED Discharge Orders     None          Midge Golas, MD 05/11/24 [phone]

## 2024-05-11 NOTE — ED Notes (Signed)
 Warm blanket applied to right foot
